# Patient Record
Sex: Male | Born: 1955
Health system: Southern US, Community
[De-identification: ages and names within clinical notes are randomized; demographics above are authoritative.]

## PROBLEM LIST (undated history)

## (undated) DIAGNOSIS — F32A Depression, unspecified: Secondary | ICD-10-CM

## (undated) DIAGNOSIS — I252 Old myocardial infarction: Secondary | ICD-10-CM

## (undated) DIAGNOSIS — I739 Peripheral vascular disease, unspecified: Secondary | ICD-10-CM

## (undated) DIAGNOSIS — E785 Hyperlipidemia, unspecified: Secondary | ICD-10-CM

## (undated) DIAGNOSIS — N529 Male erectile dysfunction, unspecified: Secondary | ICD-10-CM

## (undated) DIAGNOSIS — Z9289 Personal history of other medical treatment: Secondary | ICD-10-CM

## (undated) DIAGNOSIS — F419 Anxiety disorder, unspecified: Secondary | ICD-10-CM

## (undated) DIAGNOSIS — I2510 Atherosclerotic heart disease of native coronary artery without angina pectoris: Secondary | ICD-10-CM

## (undated) HISTORY — DX: Atherosclerotic heart disease of native coronary artery without angina pectoris: I25.10

## (undated) HISTORY — DX: Male erectile dysfunction, unspecified: N52.9

## (undated) HISTORY — DX: Anxiety disorder, unspecified: F41.9

## (undated) HISTORY — DX: Old myocardial infarction: I25.2

## (undated) HISTORY — DX: Depression, unspecified: F32.A

## (undated) HISTORY — PX: CARDIAC CATHETERIZATION: SHX172

## (undated) HISTORY — DX: Peripheral vascular disease, unspecified: I73.9

## (undated) HISTORY — DX: Personal history of other medical treatment: Z92.89

## (undated) HISTORY — DX: Hyperlipidemia, unspecified: E78.5

---

## 2003-02-27 ENCOUNTER — Inpatient Hospital Stay (HOSPITAL_COMMUNITY): Admission: EM | Admit: 2003-02-27 | Discharge: 2003-03-09 | Payer: Self-pay | Admitting: Emergency Medicine

## 2003-03-04 DIAGNOSIS — I2510 Atherosclerotic heart disease of native coronary artery without angina pectoris: Secondary | ICD-10-CM

## 2003-03-04 DIAGNOSIS — I252 Old myocardial infarction: Secondary | ICD-10-CM | POA: Insufficient documentation

## 2003-03-04 HISTORY — DX: Atherosclerotic heart disease of native coronary artery without angina pectoris: I25.10

## 2003-03-04 HISTORY — DX: Old myocardial infarction: I25.2

## 2003-03-05 HISTORY — PX: CORONARY ARTERY BYPASS GRAFT: SHX141

## 2003-03-28 ENCOUNTER — Encounter: Admission: RE | Admit: 2003-03-28 | Discharge: 2003-03-28 | Payer: Self-pay | Admitting: Cardiothoracic Surgery

## 2004-02-08 IMAGING — CR DG CHEST 1V PORT
1 series · 1 of 1 positions shown · non-contrast
Comparison: none

CLINICAL DATA: Myocardial infarction. 
 PORTABLE CHEST ([DATE])  
 Since the previous day?s portable exam, the patient has been extubated and a nasogastric tube removed.  Left chest tube is stable in place with no pneumothorax.  Swan-Ganz catheter and mediastinal drains stable.  There is some patchy perihilar and infrahilar subsegmental atelectasis or infiltrate which is slightly increased from previous exam.  Mild enlargement of the cardiac silhouette as before.
 IMPRESSION
 Extubation with some increase in perihilar and infrahilar atelectasis.

[view not recorded]
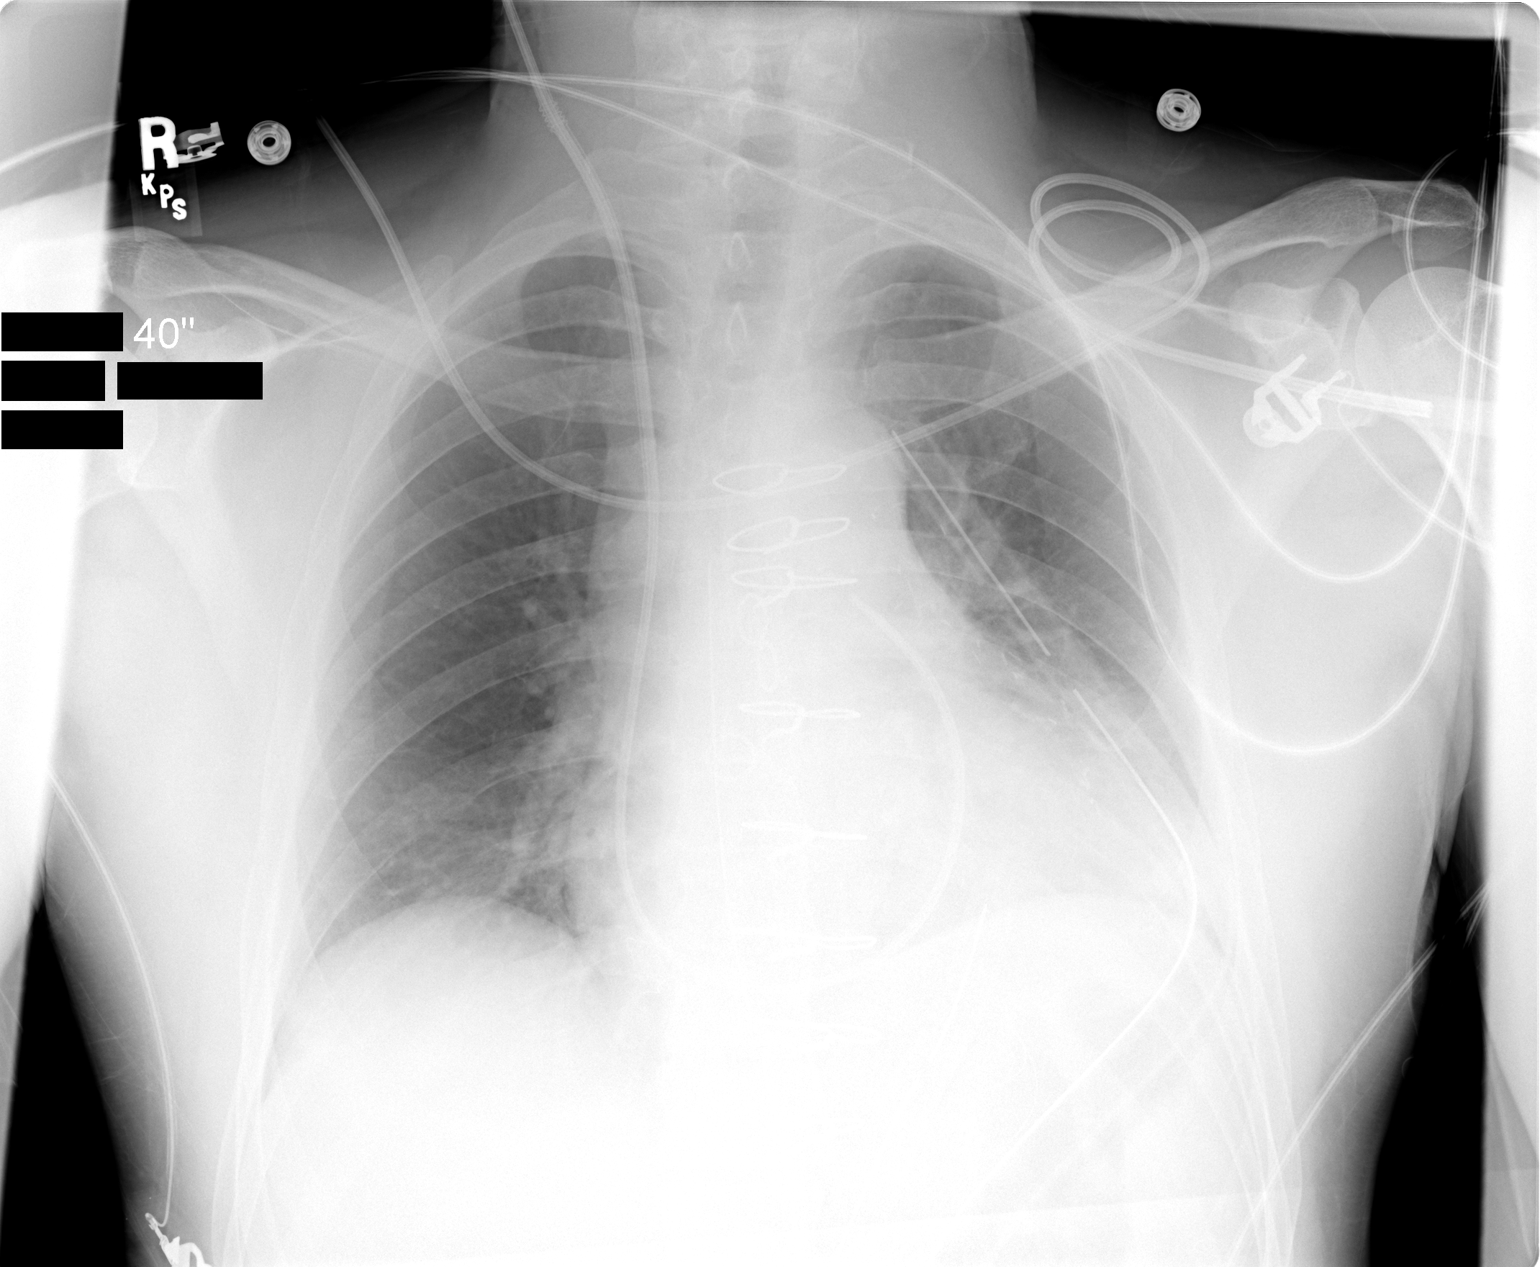

[1 of 1 positions shown; findings below may reference images not displayed]

## 2004-03-01 IMAGING — CR DG CHEST 2V
2 series · 2 of 2 positions shown · non-contrast
Comparison: none

CLINICAL DATA: Coronary artery disease, postop coronary artery bypass grafts. 
 TWO VIEW CHEST
 PA and lateral views of the chest are made and are compared to previous study of 03/07/03 from [HOSPITAL] and show slight improvement in right basilar aeration.  There remains some atelectasis and/or scarring and possibly a small effusion on the left side which has changed little. The heart is mildly enlarged and there are previous coronary artery bypass grafts noted.  
 IMPRESSION
 Interval improvement in right lower lung aeration.  There remains some atelectasis, infiltrate, and/or effusion on the left.

[view not recorded (1 of 2)]
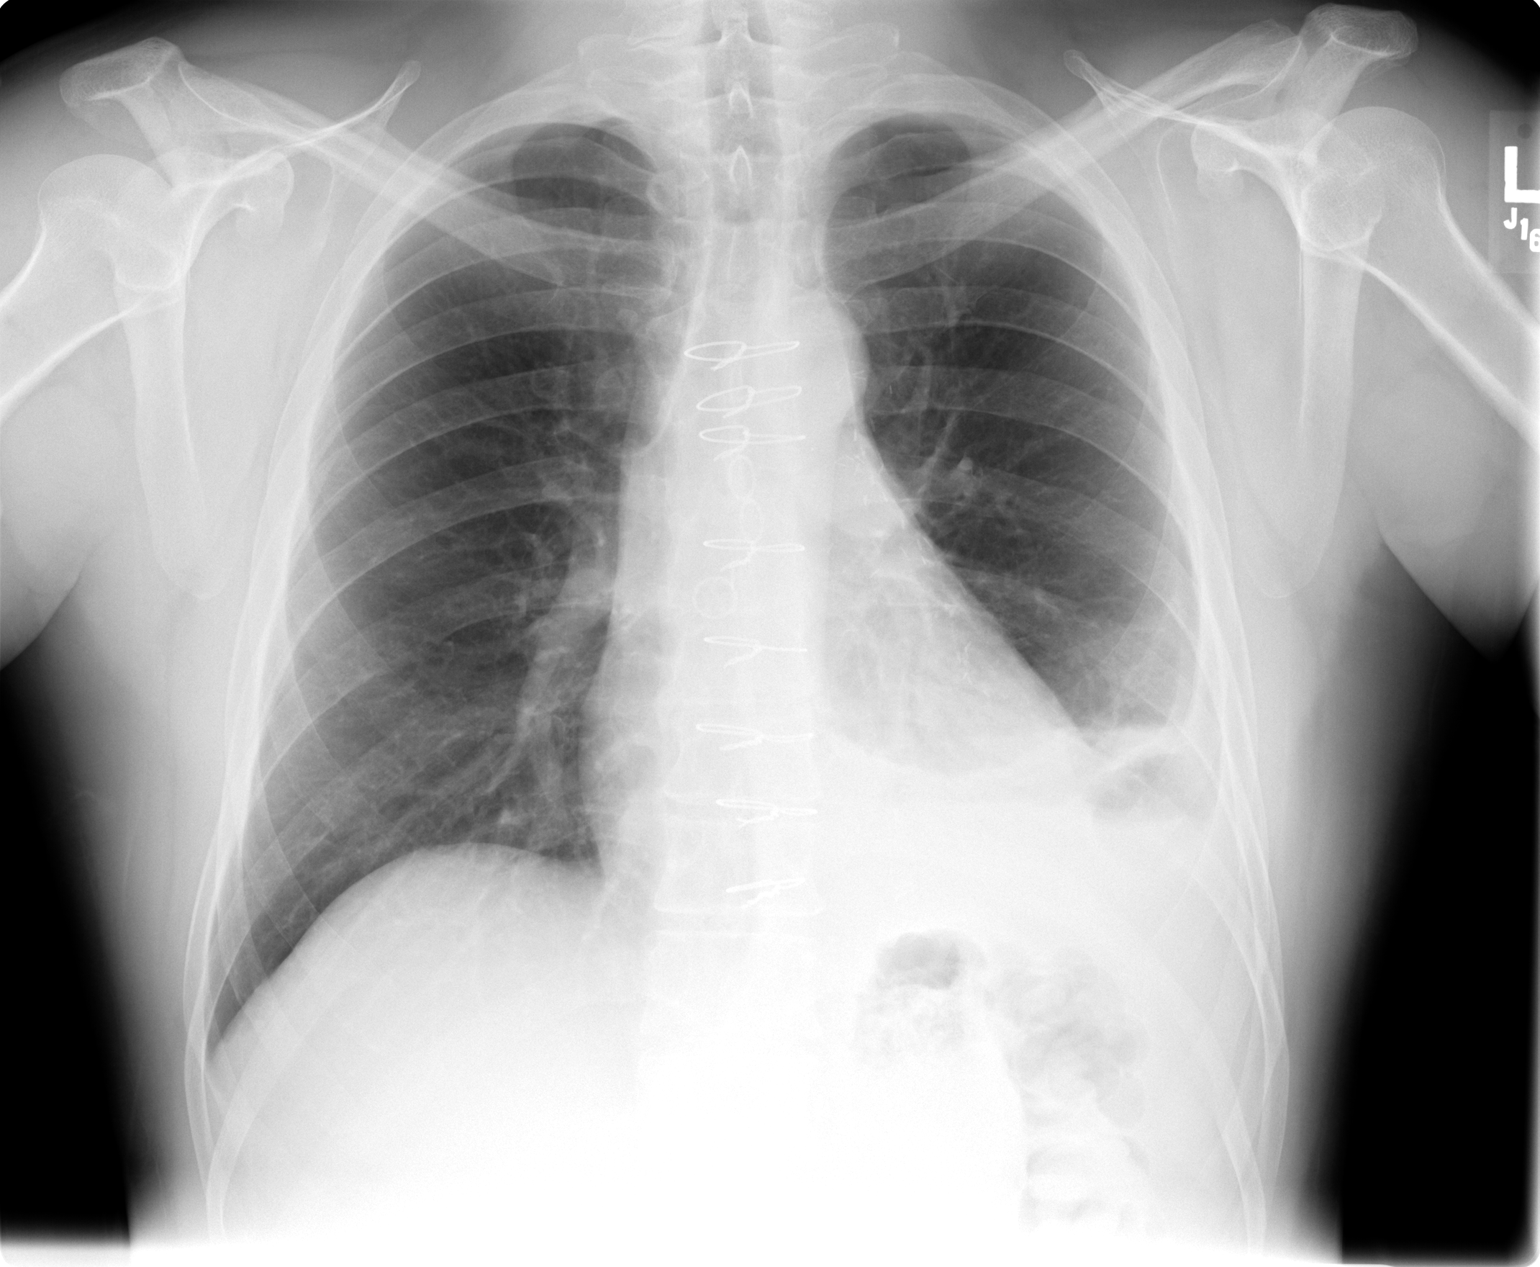

[view not recorded (2 of 2)]
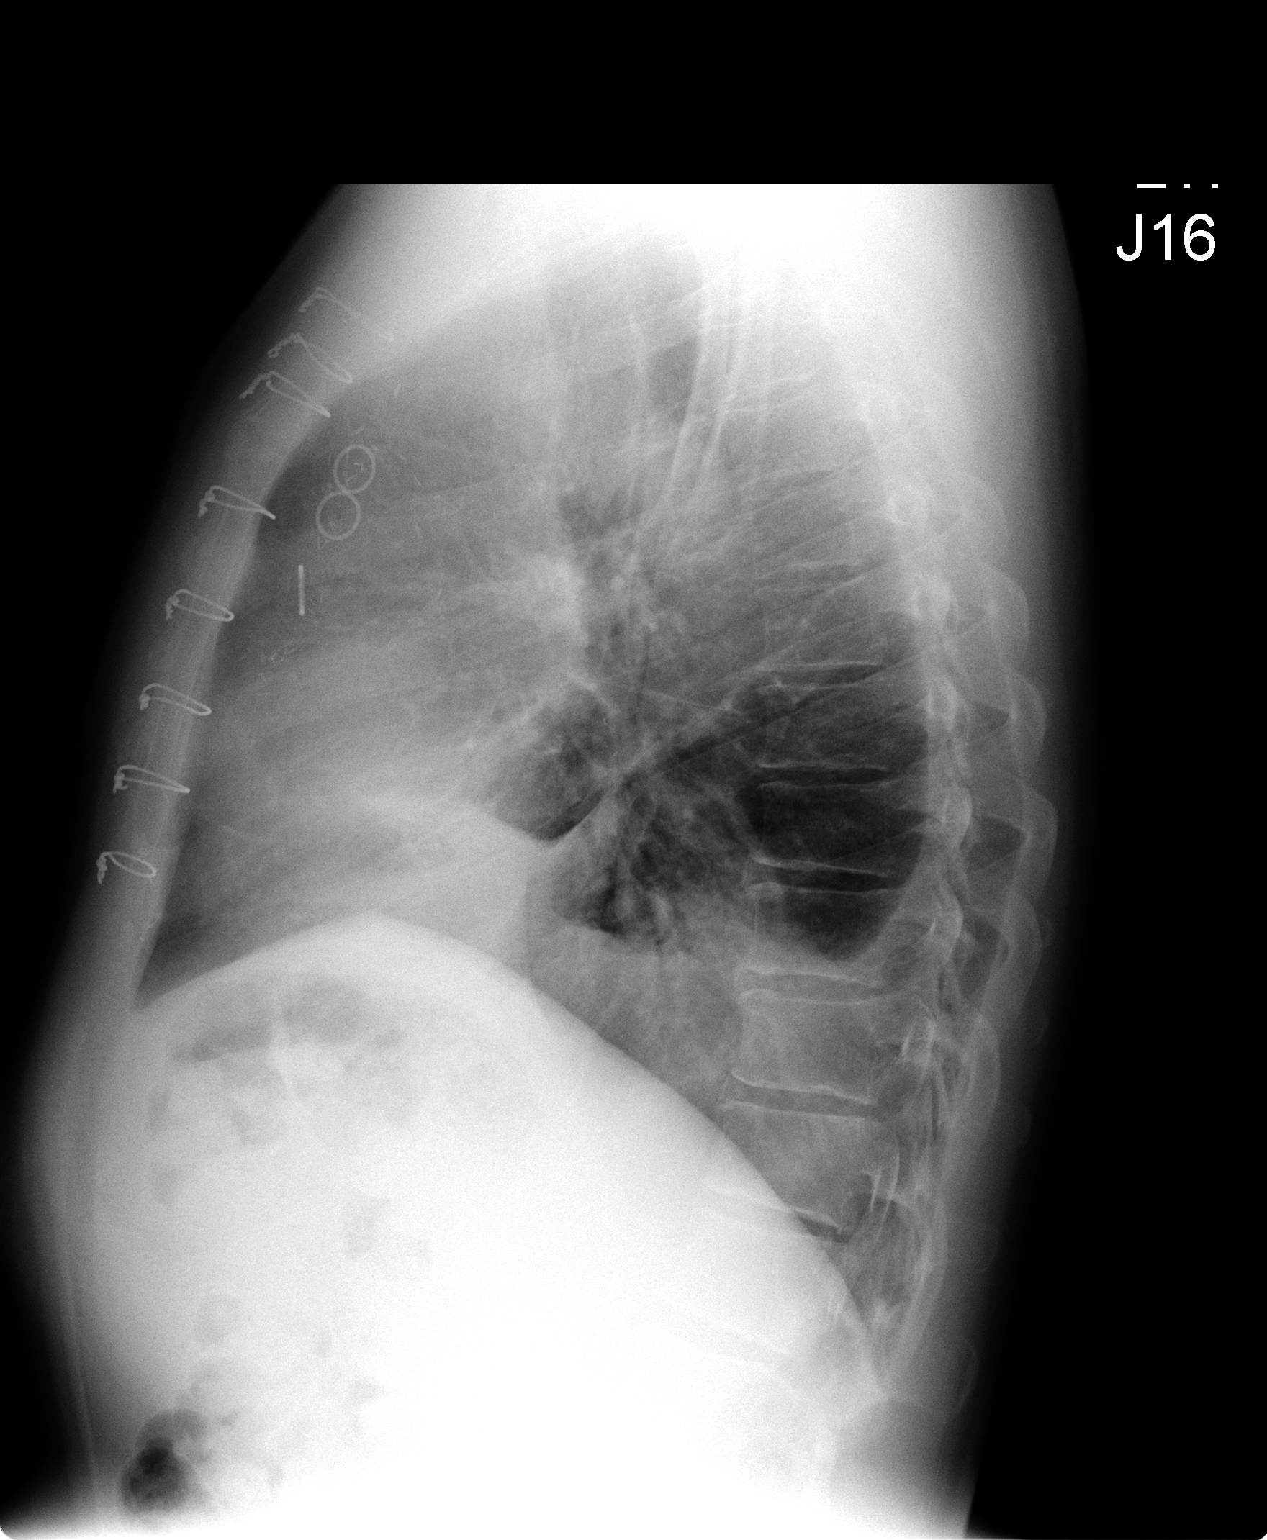

[2 of 2 positions shown; findings below may reference images not displayed]

## 2009-02-25 DIAGNOSIS — Z9289 Personal history of other medical treatment: Secondary | ICD-10-CM

## 2009-02-25 HISTORY — DX: Personal history of other medical treatment: Z92.89

## 2010-01-31 DIAGNOSIS — Z9289 Personal history of other medical treatment: Secondary | ICD-10-CM

## 2010-01-31 HISTORY — DX: Personal history of other medical treatment: Z92.89

## 2010-04-07 HISTORY — PX: COLONOSCOPY: SHX174

## 2011-08-12 DIAGNOSIS — I779 Disorder of arteries and arterioles, unspecified: Secondary | ICD-10-CM

## 2011-08-12 HISTORY — DX: Disorder of arteries and arterioles, unspecified: I77.9

## 2012-11-06 ENCOUNTER — Other Ambulatory Visit (HOSPITAL_COMMUNITY): Payer: Self-pay | Admitting: Cardiovascular Disease

## 2012-11-06 DIAGNOSIS — I6529 Occlusion and stenosis of unspecified carotid artery: Secondary | ICD-10-CM

## 2012-11-26 ENCOUNTER — Ambulatory Visit (HOSPITAL_COMMUNITY)
Admission: RE | Admit: 2012-11-26 | Discharge: 2012-11-26 | Disposition: A | Discharge status: Home / Self Care | Payer: Managed Care, Other (non HMO) | Source: Ambulatory Visit | Attending: Internal Medicine | Admitting: Internal Medicine

## 2012-11-26 DIAGNOSIS — I658 Occlusion and stenosis of other precerebral arteries: Secondary | ICD-10-CM | POA: Insufficient documentation

## 2012-11-26 DIAGNOSIS — I6529 Occlusion and stenosis of unspecified carotid artery: Secondary | ICD-10-CM

## 2012-11-26 NOTE — Progress Notes (Signed)
Carotid Duplex Completed. Laityn Bensen, BS, RDMS, RVT  

## 2012-12-02 ENCOUNTER — Encounter: Payer: Self-pay | Admitting: *Deleted

## 2012-12-04 ENCOUNTER — Encounter: Payer: Self-pay | Admitting: Cardiology

## 2012-12-04 ENCOUNTER — Encounter: Payer: Self-pay | Admitting: Cardiovascular Disease

## 2012-12-04 DIAGNOSIS — I2510 Atherosclerotic heart disease of native coronary artery without angina pectoris: Secondary | ICD-10-CM

## 2012-12-04 DIAGNOSIS — E785 Hyperlipidemia, unspecified: Secondary | ICD-10-CM | POA: Insufficient documentation

## 2012-12-04 DIAGNOSIS — I252 Old myocardial infarction: Secondary | ICD-10-CM

## 2012-12-05 ENCOUNTER — Encounter: Payer: Self-pay | Admitting: Cardiology

## 2012-12-05 ENCOUNTER — Ambulatory Visit (INDEPENDENT_AMBULATORY_CARE_PROVIDER_SITE_OTHER): Payer: Managed Care, Other (non HMO) | Admitting: Cardiology

## 2012-12-05 VITALS — BP 130/90 | HR 61 | Ht 67.0 in | Wt 180.0 lb

## 2012-12-05 DIAGNOSIS — E785 Hyperlipidemia, unspecified: Secondary | ICD-10-CM

## 2012-12-05 DIAGNOSIS — I2510 Atherosclerotic heart disease of native coronary artery without angina pectoris: Secondary | ICD-10-CM

## 2012-12-05 DIAGNOSIS — M129 Arthropathy, unspecified: Secondary | ICD-10-CM

## 2012-12-05 DIAGNOSIS — M1990 Unspecified osteoarthritis, unspecified site: Secondary | ICD-10-CM | POA: Insufficient documentation

## 2012-12-05 DIAGNOSIS — I779 Disorder of arteries and arterioles, unspecified: Secondary | ICD-10-CM

## 2012-12-05 MED ORDER — NABUMETONE 500 MG PO TABS: 500.0000 mg | ORAL_TABLET | Freq: Every day | ORAL | Status: DC

## 2012-12-05 MED ORDER — NABUMETONE 500 MG PO TABS: 500.0000 mg | ORAL_TABLET | Freq: Two times a day (BID) | ORAL | Status: DC | PRN

## 2012-12-05 NOTE — Assessment & Plan Note (Signed)
Followed by Dr Gates 

## 2012-12-05 NOTE — Assessment & Plan Note (Signed)
No angina 

## 2012-12-05 NOTE — Assessment & Plan Note (Signed)
He is complaining of pain in his hands

## 2012-12-05 NOTE — Assessment & Plan Note (Signed)
Dopplers 10/14 actually looked better, 0-49% bilat ICA stenosis

## 2012-12-05 NOTE — Progress Notes (Signed)
12/05/2012 Jorge Jackson   02/06/1955  [id number]  Primary Physicia Pcp Not In System Primary Cardiologist: Dr Allyson Sabal  HPI:  The patient is a very pleasant 57 year old married Caucasian male father of 1, grandfather to 1 grandson who I last saw Dr Allyson Sabal 6 months ago. He works at  [employer] in [place]. He had coronary artery bypass grafting in February of 2005 by Dr. Kathlee Nations Trigt with a LIMA to his LAD, a vein to a PDA, SVG-OM1 and SVG-OM2 and OM3 sequentially. He is totally asymptomatic. He has mild left internal carotid artery stenosis which we follow by duplex ultrasound and recent dopplers actually showed some improvement.. Dr. Samuel Germany follows his lipid profile. His last Myoview performed January 20, 2011 was nonischemic. He has problems with arthritis in his hands and says his sister and mother have Rheumatoid arthritis.     Current Outpatient Prescriptions  Medication Sig Dispense Refill  . ALPRAZolam (XANAX) 1 MG tablet Take 1 tablet by mouth at bedtime.      [name] aspirin 81 MG tablet Take 81 mg by mouth daily.      [name] atorvastatin (LIPITOR) 80 MG tablet Take 1 tablet by mouth at bedtime.      . clopidogrel (PLAVIX) 75 MG tablet Take 75 mg by mouth daily with breakfast.      . co-enzyme Q-10 30 MG capsule Take 30 mg by mouth 3 (three) times daily.      . fish oil-omega-3 fatty acids 1000 MG capsule Take 2 g by mouth daily.      [name] losartan (COZAAR) 50 MG tablet Take 50 mg by mouth daily.      . metoprolol succinate (TOPROL-XL) 50 MG 24 hr tablet Take 75 mg by mouth daily. Take with or immediately following a meal.      . nabumetone (RELAFEN) 500 MG tablet Take 1 tablet (500 mg total) by mouth 2 (two) times daily as needed for mild pain.  60 tablet  2  . tamsulosin (FLOMAX) 0.4 MG CAPS capsule Take 0.4 mg by mouth daily.      . traMADol (ULTRAM) 50 MG tablet Take 1 tablet by mouth as needed.       No current facility-administered medications for this visit.    Allergies  Allergen  Reactions  . Lipitor [Atorvastatin]   . Milk-Related Compounds     History   Social History  . Marital Status: Married    Spouse Name: N/A    Number of Children: N/A  . Years of Education: N/A   Occupational History  . Not on file.   Social History Main Topics  . Smoking status: Former Smoker    Types: Cigarettes    Quit date: 02/01/2003  . Smokeless tobacco: Not on file  . Alcohol Use: 0.6 oz/week    1 Cans of beer per week  . Drug Use: Not on file  . Sexual Activity: Not on file   Other Topics Concern  . Not on file   Social History Narrative  . No narrative on file     Review of Systems: General: negative for chills, fever, night sweats or weight changes.  Cardiovascular: negative for chest pain, dyspnea on exertion, edema, orthopnea, palpitations, paroxysmal nocturnal dyspnea or shortness of breath Dermatological: negative for rash Respiratory: negative for cough or wheezing Urologic: negative for hematuria Abdominal: negative for nausea, vomiting, diarrhea, bright red blood per rectum, melena, or hematemesis Neurologic: negative for visual changes, syncope, or dizziness All other systems  reviewed and are otherwise negative except as noted above.    Blood pressure 130/90, pulse 61, height 5\' 7"  (1.702 m), weight 180 lb (81.647 kg).  General appearance: alert, cooperative and no distress Neck: no carotid bruit and no JVD Lungs: clear to auscultation bilaterally Heart: regular rate and rhythm, S1, S2 normal, no murmur, click, rub or gallop Extremities: no edema  EKG NSR, incomplete RBBB  ASSESSMENT AND PLAN:   CAD CABG X 5 '05. Low risk Myoview 12/12 No angina  Mild carotid artery disease Dopplers 10/14 actually looked better, 0-49% bilat ICA stenosis  Hyperlipidemia LDL goal <70 Followed by Dr Kevan Ny  Arthritis He is complaining of pain in his hands    PLAN  I did give him an Rx for Relafen 500 mg BID which he says has helped in the past. He  will follow up with Dr Samuel Germany about this. We will get his recent lipid profile from Dr Martha Clan office. He can see Dr Allyson Sabal in 6 months.  Jermario Kalmar KPA-C 12/05/2012 1:20 PM

## 2012-12-05 NOTE — Patient Instructions (Signed)
Relafen 500 mg twice a day as needed for arthritis Your physician recommends that you schedule a follow-up appointment in: 6 months with Dr Allyson Sabal

## 2012-12-11 ENCOUNTER — Encounter: Payer: Self-pay | Admitting: Cardiology

## 2013-03-04 ENCOUNTER — Other Ambulatory Visit: Payer: Self-pay

## 2013-03-04 DIAGNOSIS — M1990 Unspecified osteoarthritis, unspecified site: Secondary | ICD-10-CM

## 2013-03-04 NOTE — Telephone Encounter (Signed)
Please advise for Nabumetone refills?

## 2013-03-05 NOTE — Telephone Encounter (Signed)
This should be handled by his primary care provider.  Kerin Ransom PA-C 03/05/2013 8:01 AM

## 2013-03-05 NOTE — Telephone Encounter (Signed)
Rx denied, per Kerin Ransom. Deferred to patient's PCP.

## 2013-03-08 ENCOUNTER — Telehealth: Payer: Self-pay | Admitting: Cardiology

## 2013-03-08 NOTE — Telephone Encounter (Signed)
Need to resend his Nabumetone prescription,it was not sent. Also he wants to know if it can be increased to 750 mg please.

## 2013-03-08 NOTE — Telephone Encounter (Signed)
Returned call and pt verified x 2.  Pt informed message received and refills will need to go to PCP.  Pt stated he still has refills, but wanted to get them through mail order.  Pt advised to use refills left and contact PCP about new Rx as we will not be able refill.  Pt verbalized understanding and agreed w/ plan.

## 2013-06-03 ENCOUNTER — Telehealth: Payer: Self-pay | Admitting: Cardiovascular Disease

## 2013-06-07 NOTE — Telephone Encounter (Signed)
Closed encounter °

## 2013-06-14 ENCOUNTER — Encounter: Payer: Self-pay | Admitting: Cardiovascular Disease

## 2013-06-14 ENCOUNTER — Ambulatory Visit (INDEPENDENT_AMBULATORY_CARE_PROVIDER_SITE_OTHER): Payer: Managed Care, Other (non HMO) | Admitting: Cardiovascular Disease

## 2013-06-14 VITALS — BP 120/68 | HR 73 | Ht 66.0 in | Wt 184.0 lb

## 2013-06-14 DIAGNOSIS — I2510 Atherosclerotic heart disease of native coronary artery without angina pectoris: Secondary | ICD-10-CM

## 2013-06-14 DIAGNOSIS — E785 Hyperlipidemia, unspecified: Secondary | ICD-10-CM

## 2013-06-14 NOTE — Assessment & Plan Note (Signed)
Coronary artery disease status post coronary artery bypass grafting February 2005 by Dr. Tharon Aquas Trigt with  With a  LIMA to his LAD, vein to the PDA, OM1 and 2 sequentially. He is totally asymptomatic. His last Myoview performed 01/20/11 was nonischemic.

## 2013-06-14 NOTE — Assessment & Plan Note (Signed)
On statin therapy followed by his PCP 

## 2013-06-14 NOTE — Patient Instructions (Signed)
Your physician wants you to follow-up in: 1 year with Dr Berry. You will receive a reminder letter in the mail two months in advance. If you don't receive a letter, please call our office to schedule the follow-up appointment.  

## 2013-06-14 NOTE — Progress Notes (Signed)
06/14/2013 Jorge Jackson   09-May-1955  [id number]  Primary Physician Pcp Not In System Primary Cardiologist: Lorretta Harp MD Renae Gloss   HPI:  The patient is a very pleasant 58 year old mildly overweight married Caucasian male father of 23, grandfather to 1 grandson who I last saw 6 months ago. He lost his job at the end of 2011. He worked at the [employer] in [place] which was outsourced to [place]. He was then hired by a [employer] in March of last year in [place] where he still works. He had coronary artery bypass grafting in February of 2005 by Dr. Tharon Aquas Trigt with a LIMA to his LAD, a vein to a PDA, OM1 and OM2 sequentially. He is totally asymptomatic. He has mild left internal carotid artery stenosis which we follow by duplex ultrasound. Dr. Micheal Likens follows his lipid profile. His last Myoview performed January 20, 2011 was nonischemic.he was seen by Kerin Ransom PAC 6 months ago. Since that time has remained clinically stable.     Current Outpatient Prescriptions  Medication Sig Dispense Refill  . ALPRAZolam (XANAX) 1 MG tablet Take 1 tablet by mouth at bedtime.      [name] aspirin 81 MG tablet Take 81 mg by mouth daily.      [name] atorvastatin (LIPITOR) 80 MG tablet Take 1 tablet by mouth at bedtime.      . clopidogrel (PLAVIX) 75 MG tablet Take 75 mg by mouth daily with breakfast.      . co-enzyme Q-10 30 MG capsule Take 30 mg by mouth 3 (three) times daily.      . fish oil-omega-3 fatty acids 1000 MG capsule Take 2 g by mouth daily.      [name] losartan (COZAAR) 50 MG tablet Take 50 mg by mouth daily.      . metoprolol succinate (TOPROL-XL) 50 MG 24 hr tablet Take 75 mg by mouth daily. Take with or immediately following a meal.      . nabumetone (RELAFEN) 500 MG tablet Take 1 tablet (500 mg total) by mouth 2 (two) times daily as needed for mild pain.  60 tablet  2  . tamsulosin (FLOMAX) 0.4 MG CAPS capsule Take 0.4 mg by mouth daily.      . traMADol (ULTRAM) 50 MG  tablet Take 1 tablet by mouth as needed.       No current facility-administered medications for this visit.    Allergies  Allergen Reactions  . Lipitor [Atorvastatin]   . Milk-Related Compounds     History   Social History  . Marital Status: Married    Spouse Name: N/A    Number of Children: N/A  . Years of Education: N/A   Occupational History  . Not on file.   Social History Main Topics  . Smoking status: Former Smoker    Types: Cigarettes    Quit date: 02/01/2003  . Smokeless tobacco: Not on file  . Alcohol Use: 0.6 oz/week    1 Cans of beer per week  . Drug Use: Not on file  . Sexual Activity: Not on file   Other Topics Concern  . Not on file   Social History Narrative  . No narrative on file     Review of Systems: General: negative for chills, fever, night sweats or weight changes.  Cardiovascular: negative for chest pain, dyspnea on exertion, edema, orthopnea, palpitations, paroxysmal nocturnal dyspnea or shortness of breath Dermatological: negative for rash Respiratory: negative for cough or  wheezing Urologic: negative for hematuria Abdominal: negative for nausea, vomiting, diarrhea, bright red blood per rectum, melena, or hematemesis Neurologic: negative for visual changes, syncope, or dizziness All other systems reviewed and are otherwise negative except as noted above.    Blood pressure 120/68, pulse 73, height 5\' 6"  (1.676 m), weight 184 lb (83.462 kg).  General appearance: alert and no distress Neck: no adenopathy, no carotid bruit, no JVD, supple, symmetrical, trachea midline and thyroid not enlarged, symmetric, no tenderness/mass/nodules Lungs: clear to auscultation bilaterally Heart: regular rate and rhythm, S1, S2 normal, no murmur, click, rub or gallop Extremities: extremities normal, atraumatic, no cyanosis or edema  EKG normal sinus rhythm at 73 without ST or T wave changes  ASSESSMENT AND PLAN:   CAD CABG X 5 '05. Low risk Myoview  12/12 Coronary artery disease status post coronary artery bypass grafting February 2005 by Dr. Tharon Aquas Trigt with  With a  LIMA to his LAD, vein to the PDA, OM1 and 2 sequentially. He is totally asymptomatic. His last Myoview performed 01/20/11 was nonischemic.  Hyperlipidemia LDL goal <70 On statin therapy followed by his PCP      Lorretta Harp MD [facility], [facility] 06/14/2013 12:08 PM

## 2014-08-19 ENCOUNTER — Ambulatory Visit (INDEPENDENT_AMBULATORY_CARE_PROVIDER_SITE_OTHER): Payer: BLUE CROSS/BLUE SHIELD | Admitting: Cardiovascular Disease

## 2014-08-19 ENCOUNTER — Encounter: Payer: Self-pay | Admitting: Cardiovascular Disease

## 2014-08-19 VITALS — BP 118/92 | HR 67 | Ht 66.0 in | Wt 177.0 lb

## 2014-08-19 DIAGNOSIS — E785 Hyperlipidemia, unspecified: Secondary | ICD-10-CM

## 2014-08-19 DIAGNOSIS — I2510 Atherosclerotic heart disease of native coronary artery without angina pectoris: Secondary | ICD-10-CM | POA: Diagnosis not present

## 2014-08-19 DIAGNOSIS — I779 Disorder of arteries and arterioles, unspecified: Secondary | ICD-10-CM

## 2014-08-19 DIAGNOSIS — I739 Peripheral vascular disease, unspecified: Secondary | ICD-10-CM

## 2014-08-19 NOTE — Patient Instructions (Signed)
Dr Berry recommends that you schedule a follow-up appointment in 1 year. You will receive a reminder letter in the mail two months in advance. If you don't receive a letter, please call our office to schedule the follow-up appointment. 

## 2014-08-19 NOTE — Assessment & Plan Note (Signed)
History of coronary artery disease status post  Coronary artery bypass grafting February 2005 by Dr. Prescott Gum with a LIMA to his LAD, vein graft to OM1 and onto sequentially onto the PDA. He is totally asymptomatic. His last Myoview performed 01/20/11 was nonischemic

## 2014-08-19 NOTE — Progress Notes (Signed)
08/19/2014 Jorge Jackson   Aug 21, 1955  [id number]  Primary Physician Wende Neighbors, MD Primary Cardiologist: Lorretta Harp MD Renae Gloss   HPI:  The patient is a very pleasant 59 year old mildly overweight married Caucasian male father of 54, grandfather to 1 grandson who I last saw 06/14/13.[name] He lost his job at the end of 2011. He worked at the [employer] in [place] which was outsourced to [place]. He was then hired by a [employer] in March of last year in [place] where he still works. He had coronary artery bypass grafting in February of 2005 by Dr. Tharon Aquas Trigt with a LIMA to his LAD, a vein to a PDA, OM1 and OM2 sequentially. He is totally asymptomatic. He has mild left internal carotid artery stenosis which we follow by duplex ultrasound. Dr. Micheal Likens follows his lipid profile. His last Myoview performed January 20, 2011 was nonischemic. Since I saw him a year ago he's remained clinically stable denying chest pain or shortness of breath.   Current Outpatient Prescriptions  Medication Sig Dispense Refill  . ALPRAZolam (XANAX) 1 MG tablet Take 1 tablet by mouth at bedtime.    [name] aspirin 81 MG tablet Take 81 mg by mouth daily.    [name] atorvastatin (LIPITOR) 80 MG tablet Take 1 tablet by mouth at bedtime.    [name] CIALIS 20 MG tablet Take 20 mg by mouth as needed.     . clopidogrel (PLAVIX) 75 MG tablet Take 75 mg by mouth daily with breakfast.    . co-enzyme Q-10 30 MG capsule Take 30 mg by mouth 3 (three) times daily.    . fish oil-omega-3 fatty acids 1000 MG capsule Take 2 g by mouth daily.    [name] losartan (COZAAR) 50 MG tablet Take 50 mg by mouth daily.    . metoprolol succinate (TOPROL-XL) 50 MG 24 hr tablet Take 75 mg by mouth daily. Take with or immediately following a meal.    . tamsulosin (FLOMAX) 0.4 MG CAPS capsule Take 0.4 mg by mouth daily.    . traMADol (ULTRAM) 50 MG tablet Take 1 tablet by mouth as needed.    . Vitamin D, Ergocalciferol, (DRISDOL) 50000 UNITS CAPS  capsule      No current facility-administered medications for this visit.    Allergies  Allergen Reactions  . Lipitor [Atorvastatin]   . Milk-Related Compounds     History   Social History  . Marital Status: Married    Spouse Name: N/A  . Number of Children: N/A  . Years of Education: N/A   Occupational History  . Not on file.   Social History Main Topics  . Smoking status: Former Smoker    Types: Cigarettes    Quit date: 02/01/2003  . Smokeless tobacco: Not on file  . Alcohol Use: 0.6 oz/week    1 Cans of beer per week  . Drug Use: Not on file  . Sexual Activity: Not on file   Other Topics Concern  . Not on file   Social History Narrative     Review of Systems: General: negative for chills, fever, night sweats or weight changes.  Cardiovascular: negative for chest pain, dyspnea on exertion, edema, orthopnea, palpitations, paroxysmal nocturnal dyspnea or shortness of breath Dermatological: negative for rash Respiratory: negative for cough or wheezing Urologic: negative for hematuria Abdominal: negative for nausea, vomiting, diarrhea, bright red blood per rectum, melena, or hematemesis Neurologic: negative for visual changes, syncope, or dizziness All other systems  reviewed and are otherwise negative except as noted above.    Blood pressure 118/92, pulse 67, height 5\' 6"  (1.676 m), weight 177 lb (80.287 kg).  General appearance: alert and no distress Neck: no adenopathy, no carotid bruit, no JVD, supple, symmetrical, trachea midline and thyroid not enlarged, symmetric, no tenderness/mass/nodules Lungs: clear to auscultation bilaterally Heart: regular rate and rhythm, S1, S2 normal, no murmur, click, rub or gallop Extremities: extremities normal, atraumatic, no cyanosis or edema  EKG normal sinus rhythm with 67 without ST or T-wave changes. I personally reviewed this EKG  ASSESSMENT AND PLAN:   Mild carotid artery disease Mild left ICA stenosis by duplex  ultrasound last checked 11/26/12  Hyperlipidemia LDL goal <70 History of hyperlipidemia on atorvastatin 80 mg a day followed by his PCP  CAD CABG X 5 '05. Low risk Myoview 12/12 History of coronary artery disease status post  Coronary artery bypass grafting February 2005 by Dr. Prescott Gum with a LIMA to his LAD, vein graft to OM1 and onto sequentially onto the PDA. He is totally asymptomatic. His last Myoview performed 01/20/11 was nonischemic      Lorretta Harp MD [place], [facility] 08/19/2014 4:36 PM

## 2014-08-19 NOTE — Assessment & Plan Note (Signed)
Mild left ICA stenosis by duplex ultrasound last checked 11/26/12

## 2014-08-19 NOTE — Assessment & Plan Note (Signed)
History of hyperlipidemia on atorvastatin 80 mg a day followed by his PCP 

## 2015-08-17 DIAGNOSIS — Z0000 Encounter for general adult medical examination without abnormal findings: Secondary | ICD-10-CM | POA: Diagnosis not present

## 2015-08-17 DIAGNOSIS — E785 Hyperlipidemia, unspecified: Secondary | ICD-10-CM | POA: Diagnosis not present

## 2015-08-17 DIAGNOSIS — I2510 Atherosclerotic heart disease of native coronary artery without angina pectoris: Secondary | ICD-10-CM | POA: Diagnosis not present

## 2015-08-17 DIAGNOSIS — E559 Vitamin D deficiency, unspecified: Secondary | ICD-10-CM | POA: Diagnosis not present

## 2015-08-17 DIAGNOSIS — I10 Essential (primary) hypertension: Secondary | ICD-10-CM | POA: Diagnosis not present

## 2015-08-21 ENCOUNTER — Encounter: Payer: Self-pay | Admitting: Cardiovascular Disease

## 2015-09-30 ENCOUNTER — Encounter: Payer: Self-pay | Admitting: Cardiovascular Disease

## 2015-09-30 ENCOUNTER — Telehealth: Payer: Self-pay | Admitting: Cardiovascular Disease

## 2015-09-30 NOTE — Telephone Encounter (Signed)
Closed encounter °

## 2015-10-16 ENCOUNTER — Ambulatory Visit: Payer: BLUE CROSS/BLUE SHIELD | Admitting: Cardiovascular Disease

## 2015-11-04 ENCOUNTER — Ambulatory Visit: Payer: BLUE CROSS/BLUE SHIELD | Admitting: Cardiovascular Disease

## 2015-11-06 ENCOUNTER — Ambulatory Visit (INDEPENDENT_AMBULATORY_CARE_PROVIDER_SITE_OTHER): Payer: BLUE CROSS/BLUE SHIELD | Admitting: Cardiovascular Disease

## 2015-11-06 ENCOUNTER — Encounter: Payer: Self-pay | Admitting: Cardiovascular Disease

## 2015-11-06 VITALS — BP 134/90 | HR 67 | Ht 67.0 in | Wt 184.4 lb

## 2015-11-06 DIAGNOSIS — R0683 Snoring: Secondary | ICD-10-CM

## 2015-11-06 DIAGNOSIS — Z0000 Encounter for general adult medical examination without abnormal findings: Secondary | ICD-10-CM

## 2015-11-06 DIAGNOSIS — G4730 Sleep apnea, unspecified: Secondary | ICD-10-CM

## 2015-11-06 NOTE — Patient Instructions (Signed)
Medication Instructions:  NO CHANGES.   Testing/Procedures: Your physician has recommended that you have a sleep study. This test records several body functions during sleep, including: brain activity, eye movement, oxygen and carbon dioxide blood levels, heart rate and rhythm, breathing rate and rhythm, the flow of air through your mouth and nose, snoring, body muscle movements, and chest and belly movement.    Follow-Up: Your physician wants you to follow-up in: [place].   You will receive a reminder letter in the mail two months in advance. If you don't receive a letter, please call our office to schedule the follow-up appointment.   If you need a refill on your cardiac medications before your next appointment, please call your pharmacy.

## 2015-11-06 NOTE — Assessment & Plan Note (Signed)
History of hyperlipidemia on statin therapy with recent lipid profile performed by his PCP 08/17/15 revealed a total cholesterol 141, LDL 69 and HDL of 56.

## 2015-11-06 NOTE — Assessment & Plan Note (Signed)
History of CAD status post coronary artery bypass grafting X 08 March 2003 by Dr. Tharon Aquas Trigt with a LIMA to his LAD, vein to the PDA, OM 1 and 2 sequentially. He is asymptomatic. His last Myoview performed 01/20/11 was nonischemic.

## 2015-11-06 NOTE — Progress Notes (Addendum)
11/06/2015 Jorge Jackson   [birth year]  [id number]  Primary Physician [name], MD Primary Cardiologist: Lorretta Harp MD [name], [place]  HPI:  The patient is a very pleasant 60 year old mildly overweight married Caucasian male father of 72, grandfather to 1 grandson who I last saw 08/19/14.[name] He lost his job at the end of 2011. He worked at the [employer] in [place] which was outsourced to [place]. He was then hired by a [employer] in March of last year in [place] where he still works. He had coronary artery bypass grafting in February of 2005 by Dr. Tharon Aquas Trigt with a LIMA to his LAD, a vein to a PDA, OM1 and OM2 sequentially. He is totally asymptomatic. He has mild left internal carotid artery stenosis which we follow by duplex ultrasound. Dr. Micheal Likens follows his lipid profile. His last Myoview performed January 20, 2011 was nonischemic. Since I saw him a year ago he's remained clinically stable denying chest pain or shortness of breath. His most recent lipid profile performed by his PCP 08/17/15 revealed total cholesterol 141, LDL 69 and HDL of 56. He does complain of lethargy and fatigue and relates symptoms compatible with obstructive sleep apnea. I am sending him for a sleep study and CPAP if applicable.    Current Outpatient Prescriptions  Medication Sig Dispense Refill  . ALPRAZolam (XANAX) 1 MG tablet Take 1 tablet by mouth at bedtime.    [name] aspirin 81 MG tablet Take 81 mg by mouth daily.    [name] atorvastatin (LIPITOR) 80 MG tablet Take 1 tablet by mouth at bedtime.    [name] CIALIS 20 MG tablet Take 20 mg by mouth as needed.     . clopidogrel (PLAVIX) 75 MG tablet Take 75 mg by mouth daily with breakfast.    . co-enzyme Q-10 30 MG capsule Take 30 mg by mouth 3 (three) times daily.    . fish oil-omega-3 fatty acids 1000 MG capsule Take 2 g by mouth daily.    [name] losartan (COZAAR) 50 MG tablet Take 50 mg by mouth daily.    . metoprolol succinate (TOPROL-XL) 50 MG 24 hr  tablet Take 75 mg by mouth daily. Take with or immediately following a meal.    . tamsulosin (FLOMAX) 0.4 MG CAPS capsule Take 0.4 mg by mouth daily.    . traMADol (ULTRAM) 50 MG tablet Take 1 tablet by mouth as needed.     No current facility-administered medications for this visit.     Allergies  Allergen Reactions  . Lipitor [Atorvastatin]   . Milk-Related Compounds     Social History   Social History  . Marital status: Married    Spouse name: N/A  . Number of children: N/A  . Years of education: N/A   Occupational History  . Not on file.   Social History Main Topics  . Smoking status: Former Smoker    Types: Cigarettes    Quit date: 02/01/2003  . Smokeless tobacco: Not on file  . Alcohol use 0.6 oz/week    1 Cans of beer per week  . Drug use: Unknown  . Sexual activity: Not on file   Other Topics Concern  . Not on file   Social History Narrative  . No narrative on file     Review of Systems: General: negative for chills, fever, night sweats or weight changes.  Cardiovascular: negative for chest pain, dyspnea on exertion, edema, orthopnea, palpitations, paroxysmal nocturnal dyspnea  or shortness of breath Dermatological: negative for rash Respiratory: negative for cough or wheezing Urologic: negative for hematuria Abdominal: negative for nausea, vomiting, diarrhea, bright red blood per rectum, melena, or hematemesis Neurologic: negative for visual changes, syncope, or dizziness All other systems reviewed and are otherwise negative except as noted above.    Blood pressure 134/90, pulse 67, height 5\' 7"  (1.702 m), weight 184 lb 6.4 oz (83.6 kg).  General appearance: alert and no distress Neck: no adenopathy, no carotid bruit, no JVD, supple, symmetrical, trachea midline and thyroid not enlarged, symmetric, no tenderness/mass/nodules Lungs: clear to auscultation bilaterally Heart: regular rate and rhythm, S1, S2 normal, no murmur, click, rub or  gallop Extremities: extremities normal, atraumatic, no cyanosis or edema  EKG sinus rhythm at 67 without ST or T-wave changes. I personally reviewed this EKG  ASSESSMENT AND PLAN:   CAD CABG X 5 '05. Low risk Myoview 12/12 History of CAD status post coronary artery bypass grafting X 08 March 2003 by Dr. Tharon Aquas Trigt with a LIMA to his LAD, vein to the PDA, OM 1 and 2 sequentially. He is asymptomatic. His last Myoview performed 01/20/11 was nonischemic.  Hyperlipidemia LDL goal <70 History of hyperlipidemia on statin therapy with recent lipid profile performed by his PCP 08/17/15 revealed a total cholesterol 141, LDL 69 and HDL of 56.      Lorretta Harp MD FACP,FACC,FAHA, [facility] 11/06/2015 4:57 PM

## 2015-12-11 ENCOUNTER — Ambulatory Visit (HOSPITAL_BASED_OUTPATIENT_CLINIC_OR_DEPARTMENT_OTHER): Payer: BLUE CROSS/BLUE SHIELD | Attending: Cardiovascular Disease | Admitting: Cardiovascular Disease

## 2015-12-11 VITALS — Ht 67.0 in | Wt 184.0 lb

## 2015-12-11 DIAGNOSIS — R0683 Snoring: Secondary | ICD-10-CM | POA: Diagnosis not present

## 2015-12-11 DIAGNOSIS — G4733 Obstructive sleep apnea (adult) (pediatric): Secondary | ICD-10-CM | POA: Diagnosis not present

## 2015-12-11 DIAGNOSIS — G4730 Sleep apnea, unspecified: Secondary | ICD-10-CM

## 2015-12-28 ENCOUNTER — Telehealth: Payer: Self-pay | Admitting: Cardiovascular Disease

## 2015-12-28 NOTE — Telephone Encounter (Signed)
Jorge Jackson is calling to get his results of his sleep study . Please call

## 2015-12-29 NOTE — Telephone Encounter (Signed)
Pt calling again,he would like his sleep study results please.

## 2015-12-29 NOTE — Telephone Encounter (Signed)
Split night study on 11/10 - pending Dr. Evette Georges interpretation of results. Pt aware this is pending physician review and we will call when ready. Pt voiced appreciation and understanding.

## 2016-01-06 ENCOUNTER — Encounter: Payer: Self-pay | Admitting: Cardiovascular Disease

## 2016-01-06 NOTE — Procedures (Signed)
Patient Name: Jorge Jackson, Jorge Jackson Date: 12/11/2015 Gender: Male D.O.B: [birth year] Age (years): 19 Referring Provider: Lorretta Harp Height (inches): 67 Interpreting Physician: [name] MD, ABSM Weight (lbs): 189 RPSGT: Gerhard Perches BMI: 30 MRN: [id number] Neck Size: 17.00  CLINICAL INFORMATION Sleep Study Type: Split Night CPAP Indication for sleep study: Snoring Epworth Sleepiness Score: 15  SLEEP STUDY TECHNIQUE As per the AASM Manual for the Scoring of Sleep and Associated Events v2.3 (April 2016) with a hypopnea requiring 4% desaturations. The channels recorded and monitored were frontal, central and occipital EEG, electrooculogram (EOG), submentalis EMG (chin), nasal and oral airflow, thoracic and abdominal wall motion, anterior tibialis EMG, snore microphone, electrocardiogram, and pulse oximetry. Continuous positive airway pressure (CPAP) was initiated when the patient met split night criteria and was titrated according to treat sleep-disordered breathing.  MEDICATIONS ALPRAZolam (XANAX) 1 MG tablet aspirin 81 MG tablet atorvastatin (LIPITOR) 80 MG tablet CIALIS 20 MG tablet clopidogrel (PLAVIX) 75 MG tablet co-enzyme Q-10 30 MG capsule fish oil-omega-3 fatty acids 1000 MG capsule losartan (COZAAR) 50 MG tablet metoprolol succinate (TOPROL-XL) 50 MG 24 hr tablet tamsulosin (FLOMAX) 0.4 MG CAPS capsule traMADol (ULTRAM) 50 MG tablet  Medications self-administered by patient taken the night of the study : TIZANIDINE, XANAX  RESPIRATORY PARAMETERS Diagnostic Total AHI (/hr): 19.1 RDI (/hr): 21.4 OA Index (/hr): 4.5 CA Index (/hr): 0.3 REM AHI (/hr): 43.0 NREM AHI (/hr): 15.1 Supine AHI (/hr): 19.1 Non-supine AHI (/hr): N/A Min O2 Sat (%): 76.00 Mean O2 (%): 94.41 Time below 88% (min): 6.0   Titration Optimal Pressure (cm): 16 AHI at Optimal Pressure (/hr): 0.0 Min O2 at Optimal Pressure (%): 94.0 Supine % at Optimal (%): 100 Sleep % at Optimal  (%): 18    SLEEP ARCHITECTURE The recording time for the entire night was 357.9 minutes. During a baseline period of 191.5 minutes, the patient slept for 185.3 minutes in REM and nonREM, yielding a sleep efficiency of 96.8%. Sleep onset after lights out was 5.2 minutes with a REM latency of 151.5 minutes. The patient spent 4.05% of the night in stage N1 sleep, 81.65% in stage N2 sleep, 0.00% in stage N3 and 14.30% in REM. During the titration period of 164.1 minutes, the patient slept for 119.0 minutes in REM and nonREM, yielding a sleep efficiency of 72.5%. Sleep onset after CPAP initiation was 13.9 minutes with a REM latency of 63.5 minutes. The patient spent 28.57% of the night in stage N1 sleep, 59.66% in stage N2 sleep, 0.00% in stage N3 and 11.76% in REM.  CARDIAC DATA The 2 lead EKG demonstrated sinus rhythm. The mean heart rate was 61.26 beats per minute. Other EKG findings include: None.  LEG MOVEMENT DATA The total Periodic Limb Movements of Sleep (PLMS) were 0. The PLMS index was 0.00.  IMPRESSIONS - Moderate obstructive sleep apnea overall (AHI = 19.1/hour); however, severe sleep apnea during REM sleep (AHI 43.0/h).   The patient was titrated to an optimal PAP pressure at 16 cm of water.  AHI at 16 cm was 0 with a minimum oxygen saturation at 94%. - No significant central sleep apnea occurred during the diagnostic portion of the study (CAI = 0.3/hour). - Significant oxygen desaturation to a nadir of 76.00% during the diagnostic study. - The patient snored with Moderate snoring volume during the diagnostic portion of the study. - No cardiac abnormalities were noted during this study. - Clinically significant periodic limb movements did not occur during sleep.  DIAGNOSIS -  Obstructive Sleep Apnea (327.23 [G47.33 ICD-10])  RECOMMENDATIONS - Recommend an initial trial of CPAP therapy at 16 cm H2O with  heated humidification. A Medium size Resmed Full Face Mask AirFit F20 mask was  used for the titration. - Efforts should be made to optimize nasal and oropharyngeal patency. - Avoid alcohol, sedatives and other CNS depressants that may worsen sleep apnea and disrupt normal sleep architecture. - Sleep hygiene should be reviewed to assess factors that may improve sleep quality. - Weight management and regular exercise should be initiated or continued. - Recommend a download be obtained in 30 days and sleep clinic evaluation.  [Electronically signed] 01/06/2016 10:49 PM  [name] MD, [facility], [place], American Board of Sleep Medicine   NPI: [id number] [place] PH: [phone]   FX: [phone] [place]

## 2016-01-14 NOTE — Telephone Encounter (Signed)
New message  Pt is calling for the second time for results of the sleep study done on 11/10  Please call back and advise

## 2016-01-21 ENCOUNTER — Telehealth: Payer: Self-pay | Admitting: *Deleted

## 2016-01-21 ENCOUNTER — Telehealth: Payer: Self-pay | Admitting: Cardiovascular Disease

## 2016-01-21 NOTE — Telephone Encounter (Signed)
Left message for patient to return a call to discuss results of sleep study.

## 2016-01-21 NOTE — Telephone Encounter (Signed)
Left message to call to discuss sleep study results.

## 2016-01-21 NOTE — Telephone Encounter (Signed)
Faxed CPAP order to [facility] for set up.

## 2016-01-21 NOTE — Telephone Encounter (Signed)
Left message to return a call to discuss sleep study results. 

## 2016-01-21 NOTE — Telephone Encounter (Signed)
°  Follow Up   Pt is returning call from earlier. Pt is requesting a phone call at 4:00 PM as he is at work currently.

## 2016-01-22 ENCOUNTER — Telehealth: Payer: Self-pay | Admitting: Cardiovascular Disease

## 2016-01-22 NOTE — Telephone Encounter (Signed)
New message  Pt is calling again to get results of sleep study  Please call back

## 2016-01-22 NOTE — Telephone Encounter (Signed)
Spoke with patient giving him sleep study results and recommendations. Patient voiced understanding.

## 2016-01-22 NOTE — Telephone Encounter (Signed)
Returned a call to patient informing him of sleep study results and recommendations. Patient voiced understanding.

## 2016-02-04 NOTE — Telephone Encounter (Signed)
Patient should have receive results and have been set up by DME company for CPAP initiation.

## 2016-02-12 DIAGNOSIS — G4733 Obstructive sleep apnea (adult) (pediatric): Secondary | ICD-10-CM | POA: Diagnosis not present

## 2016-02-19 DIAGNOSIS — Z6829 Body mass index (BMI) 29.0-29.9, adult: Secondary | ICD-10-CM | POA: Diagnosis not present

## 2016-02-19 DIAGNOSIS — E663 Overweight: Secondary | ICD-10-CM | POA: Diagnosis not present

## 2016-02-19 DIAGNOSIS — E785 Hyperlipidemia, unspecified: Secondary | ICD-10-CM | POA: Diagnosis not present

## 2016-02-19 DIAGNOSIS — M545 Low back pain: Secondary | ICD-10-CM | POA: Diagnosis not present

## 2016-02-19 DIAGNOSIS — E559 Vitamin D deficiency, unspecified: Secondary | ICD-10-CM | POA: Diagnosis not present

## 2016-02-19 DIAGNOSIS — I10 Essential (primary) hypertension: Secondary | ICD-10-CM | POA: Diagnosis not present

## 2016-03-14 DIAGNOSIS — G4733 Obstructive sleep apnea (adult) (pediatric): Secondary | ICD-10-CM | POA: Diagnosis not present

## 2016-03-23 ENCOUNTER — Encounter: Payer: Self-pay | Admitting: Cardiovascular Disease

## 2016-03-23 ENCOUNTER — Ambulatory Visit (INDEPENDENT_AMBULATORY_CARE_PROVIDER_SITE_OTHER): Payer: BLUE CROSS/BLUE SHIELD | Admitting: Cardiovascular Disease

## 2016-03-23 VITALS — BP 120/83 | Ht 67.0 in | Wt 187.0 lb

## 2016-03-23 DIAGNOSIS — R0683 Snoring: Secondary | ICD-10-CM

## 2016-03-23 DIAGNOSIS — E785 Hyperlipidemia, unspecified: Secondary | ICD-10-CM

## 2016-03-23 DIAGNOSIS — I779 Disorder of arteries and arterioles, unspecified: Secondary | ICD-10-CM | POA: Diagnosis not present

## 2016-03-23 DIAGNOSIS — I2510 Atherosclerotic heart disease of native coronary artery without angina pectoris: Secondary | ICD-10-CM | POA: Diagnosis not present

## 2016-03-23 DIAGNOSIS — I739 Peripheral vascular disease, unspecified: Secondary | ICD-10-CM

## 2016-03-23 DIAGNOSIS — G4733 Obstructive sleep apnea (adult) (pediatric): Secondary | ICD-10-CM

## 2016-03-23 NOTE — Patient Instructions (Signed)
Your physician recommends that you schedule a follow-up appointment if needed for sleep with Dr Claiborne Billings.

## 2016-03-30 NOTE — Progress Notes (Signed)
Cardiology Office Note    Date:  03/30/2016   ID:  Jorge Jackson, DOB May 04, 1955, MRN [id number]  PCP:  Jorge Doyne, MD  Cardiologist:  Jorge Majestic, MD ( sleep); Dr. Gwenlyn Jackson  Chief Complaint  Patient presents with  . New Evaluation  . Sleep Apnea    History of Present Illness:  Jorge Jackson is a 61 y.o. male who presents for his initial sleep clinic evaluation following initiation of CPAP therapy.  Mr. Jorge Jackson is followed by Dr. Gwenlyn Jackson for his cardiology care.  He underwent CABG revascularization surgery in February 2005 by Dr. [name] trite and had a LIMA placed to his LAD, veins to the PDA, OM1, and OM2 vessels.  When he had recently seen Dr. Gwenlyn Jackson.  He was complaining of lethargy and fatigue, and there was concern of symptoms associated with sleep apnea.  He was referred for a sleep study which was done on 12/11/2015.  This revealed moderate sleep apnea with an HI of 19.1 per hour; however sleep apnea was severe during grams sleep with an AHI of 43.0.  There was significant oxygen desaturation to a nadir of 76% and there was evidence for moderate snoring.  CPAP was implemented and he was titrated up to 16 cm water pressure.  At that pressure.  AHI was 0 and minimum oxygen saturation was 94%.  His DME company is [facility] and is set up date was 02/12/2016.  He has a ResMed AirSense 10 AutoSet unit which has been at a set pressure of 16 cm.  He has a [employer] F 20 medium mask.  A download was obtained from 02/20/2016 through 03/20/2016.  He is meeting compliance standards with 90% of usage stays and 83% of days with usage greater than 4 hours.  However, his average use is only 6 hours and 5 minutes.  AHI is 8.8, with an apnea index of 7.0, and a hypopnea index of 1.8.  Central index was 0.7.  Except for 2 days, there was no significant leak.  Typically he goes to bed at 9 PM but wakes up at 3:50 AM since he works for [employer] and is at work at 5 AM.  Since initiating CPAP,  he feels better.  He is rested when he awakens.  The past he had significant snoring on his back.  An Epworth Sleepiness Scale score was calculated in the office today and this endorsed at 12, consistent with mild residual daytime sleepiness as shown below.  Epworth Sleepiness Scale: Situation   Chance of Dozing/Sleeping (0 = never , 1 = slight chance , 2 = moderate chance , 3 = high chance )   sitting and reading 2   watching TV 2   sitting inactive in a public place 2   being a passenger in a motor vehicle for an hour or more 1   lying down in the afternoon 3   sitting and talking to someone 0   sitting quietly after lunch (no alcohol) 2   while stopped for a few minutes in traffic as the driver 0   Total Score  12       Past Medical History:  Diagnosis Date  . CAD (coronary artery disease) 03/2003   hx CABG X 5   . H/O cardiovascular stress test 2012   EF 76%, normal perfusion  . H/O echocardiogram 02/25/2009   EF >55%, suggestion of impaired LV relaxtion, tr MR  . Hx of myocardial  infarction 03/2003   cath-CABG  . Hyperlipidemia LDL goal <70   . Mild carotid artery disease ([place]) 08/12/2011   Lt carotid by ultrasound, 50-69%, rt 0-49%    Past Surgical History:  Procedure Laterality Date  . CARDIAC CATHETERIZATION    . CORONARY ARTERY BYPASS GRAFT  03/05/2003   LIMA-LAD; VG-PDA; VG-OM1; sequential VG-OM 2 and OM 3    Current Medications: Outpatient Medications Prior to Visit  Medication Sig Dispense Refill  . ALPRAZolam (XANAX) 1 MG tablet Take 1 tablet by mouth at bedtime.    [name] aspirin 81 MG tablet Take 81 mg by mouth daily.    [name] atorvastatin (LIPITOR) 80 MG tablet Take 1 tablet by mouth at bedtime.    [name] CIALIS 20 MG tablet Take 20 mg by mouth as needed.     . clopidogrel (PLAVIX) 75 MG tablet Take 75 mg by mouth daily with breakfast.    . co-enzyme Q-10 30 MG capsule Take 30 mg by mouth 3 (three) times daily.    . fish oil-omega-3 fatty acids 1000 MG capsule Take 2 g  by mouth daily.    [name] losartan (COZAAR) 50 MG tablet Take 50 mg by mouth daily.    . metoprolol succinate (TOPROL-XL) 50 MG 24 hr tablet Take 75 mg by mouth daily. Take with or immediately following a meal.    . tamsulosin (FLOMAX) 0.4 MG CAPS capsule Take 0.4 mg by mouth daily.    . traMADol (ULTRAM) 50 MG tablet Take 1 tablet by mouth as needed.     No facility-administered medications prior to visit.      Allergies:   Milk-related compounds   Social History   Social History  . Marital status: Married    Spouse name: N/A  . Number of children: N/A  . Years of education: N/A   Social History Main Topics  . Smoking status: Former Smoker    Types: Cigarettes    Quit date: 02/01/2003  . Smokeless tobacco: Former Systems developer    Types: Chew  . Alcohol use 0.6 oz/week    1 Cans of beer per week  . Drug use: Unknown  . Sexual activity: Not on file   Other Topics Concern  . Not on file   Social History Narrative  . No narrative on file     Family History:  The patient's family history includes Heart disease in his mother; Stroke (age of onset: 3) in his mother; Sudden death in his father.   ROS General: Negative; No fevers, chills, or night sweats;  HEENT: Negative; No changes in vision or hearing, sinus congestion, difficulty swallowing Pulmonary: Negative; No cough, wheezing, shortness of breath, hemoptysis Cardiovascular: Negative; No chest pain, presyncope, syncope, palpitations GI: Negative; No nausea, vomiting, diarrhea, or abdominal pain GU: Negative; No dysuria, hematuria, or difficulty voiding Musculoskeletal: Negative; no myalgias, joint pain, or weakness Hematologic/Oncology: Negative; no easy bruising, bleeding Endocrine: Negative; no heat/cold intolerance; no diabetes Neuro: Negative; no changes in balance, headaches Skin: Negative; No rashes or skin lesions Psychiatric: Negative; No behavioral problems, depression Sleep: See above, now on CPAP therapy for OSA .  Mild  residual daytime sleepiness, no bruxism, restless legs, hypnogognic hallucinations, no cataplexy Other comprehensive 14 point system review is negative.   PHYSICAL EXAM:   VS:  BP 120/83 (BP Location: Right Arm, Patient Position: Sitting, Cuff Size: Normal)   Ht 5\' 7"  (1.702 m)   Wt 187 lb (84.8 kg)   SpO2 97%   BMI 29.29  kg/m     Wt Readings from Last 3 Encounters:  03/23/16 187 lb (84.8 kg)  12/11/15 184 lb (83.5 kg)  11/06/15 184 lb 6.4 oz (83.6 kg)    General: Alert, oriented, no distress.  Skin: normal turgor, no rashes, warm and dry HEENT: Normocephalic, atraumatic. Pupils equal round and reactive to light; sclera anicteric; extraocular muscles intact; Fundi no hemorrhages or exudates.  Disc flat. Nose without nasal septal hypertrophy Mouth/Parynx benign; Mallinpatti scale 3 Neck: No JVD, no carotid bruits; normal carotid upstroke Lungs: clear to ausculatation and percussion; no wheezing or rales Chest wall: without tenderness to palpitation Heart: PMI not displaced, RRR, s1 s2 normal, faint 1/6 systolic murmur, no diastolic murmur, no rubs, gallops, thrills, or heaves Abdomen: soft, nontender; no hepatosplenomehaly, BS+; abdominal aorta nontender and not dilated by palpation. Back: no CVA tenderness Pulses 2+ Musculoskeletal: full range of motion, normal strength, no joint deformities Extremities: no clubbing cyanosis or edema, Homan's sign negative  Neurologic: grossly nonfocal; Cranial nerves grossly wnl Psychologic: Normal mood and affect   Studies/Labs Reviewed:   EKG:  EKG is not ordered today.  I have personally reviewed his last ECG from 11/06/2015 which showed normal sinus rhythm at 67 bpm.  There was no ectopy.  There were normal intervals.  Recent Labs: No flowsheet data Jackson.   No flowsheet data Jackson.  No flowsheet data Jackson. No results Jackson for: MCV No results Jackson for: TSH No results Jackson for: HGBA1C   BNP No results Jackson for:  BNP  ProBNP No results Jackson for: PROBNP   Lipid Panel  No results Jackson for: CHOL, TRIG, HDL, CHOLHDL, VLDL, LDLCALC, LDLDIRECT   RADIOLOGY: No results Jackson.   Additional studies/ records that were reviewed today include:  Office notes from Dr. Gwenlyn Jackson.  Sleep study, download data.    ASSESSMENT:    1. OSA (obstructive sleep apnea)   2. Snoring   3. Coronary artery disease involving native coronary artery of native heart without angina pectoris   4. Mild carotid artery disease ([place])   5. Hyperlipidemia LDL goal <70      PLAN:  Mr. Meer Schuerger is a 61 year old gentleman who has a history of prior CABG revascularization surgery and mild left internal carotid artery stenosis who recently was Jackson to have moderate sleep apnea on diagnostic polysomnogram.  I reviewed his sleep study in detail with him.  Although he had moderate sleep apnea.  Overall, sleep apnea was severe during Rehm sleep with an AHI of 43.0 per hour.  In addition, he had significant oxygen desaturation during the diagnostic study.  Over the past 6 weeks he has been on CPAP therapy and has been on a set pressure of 16 cm.  He is meeting compliance standards on his most recent download, but his AHI remains elevated at 8.8, which may be contributing to some of his residual daytime sleepiness.  In addition, he is only sleeping only 6 hours and 5 minutes, which is inadequate sleep duration.  I discussed with him.  Optimal sleep for an adult is 7-8 hours per night.  This has been difficult for him since he gets up at 3:50 AM to be at work at 5 AM.  I am now changing his CPAP mode to an auto mode with a range from a minimum of 12 up to 20/70 to water pressure.  This should really not resolve any of his recent apnea, which may still be occurring when his pressure was set at  a maximum of 16.  Clinically, he feels significantly improved.  I discussed with him the importance of continued compliance with CPAP.  In review, data  regarding adverse effects on cardiovascular health if his sleep apnea is left untreated.  I will see him on an as-needed basis from a sleep perspective.   Medication Adjustments/Labs and Tests Ordered: Current medicines are reviewed at length with the patient today.  Concerns regarding medicines are outlined above.  Medication changes, Labs and Tests ordered today are listed in the Patient Instructions below. Patient Instructions  Your physician recommends that you schedule a follow-up appointment if needed for sleep with Dr Claiborne Billings.     Signed, Jorge Majestic, MD  03/30/2016 1:56 PM    [facility] [address], [place], [place], [place]  [address] Phone: [phone]

## 2016-04-11 DIAGNOSIS — G4733 Obstructive sleep apnea (adult) (pediatric): Secondary | ICD-10-CM | POA: Diagnosis not present

## 2016-05-10 DIAGNOSIS — D242 Benign neoplasm of left breast: Secondary | ICD-10-CM | POA: Diagnosis not present

## 2016-05-12 DIAGNOSIS — G4733 Obstructive sleep apnea (adult) (pediatric): Secondary | ICD-10-CM | POA: Diagnosis not present

## 2016-06-11 DIAGNOSIS — G4733 Obstructive sleep apnea (adult) (pediatric): Secondary | ICD-10-CM | POA: Diagnosis not present

## 2016-07-12 DIAGNOSIS — G4733 Obstructive sleep apnea (adult) (pediatric): Secondary | ICD-10-CM | POA: Diagnosis not present

## 2016-08-11 DIAGNOSIS — G4733 Obstructive sleep apnea (adult) (pediatric): Secondary | ICD-10-CM | POA: Diagnosis not present

## 2016-08-19 DIAGNOSIS — E663 Overweight: Secondary | ICD-10-CM | POA: Diagnosis not present

## 2016-08-19 DIAGNOSIS — M62838 Other muscle spasm: Secondary | ICD-10-CM | POA: Diagnosis not present

## 2016-08-19 DIAGNOSIS — Z1389 Encounter for screening for other disorder: Secondary | ICD-10-CM | POA: Diagnosis not present

## 2016-08-19 DIAGNOSIS — Z0000 Encounter for general adult medical examination without abnormal findings: Secondary | ICD-10-CM | POA: Diagnosis not present

## 2016-08-19 DIAGNOSIS — E785 Hyperlipidemia, unspecified: Secondary | ICD-10-CM | POA: Diagnosis not present

## 2016-09-11 DIAGNOSIS — G4733 Obstructive sleep apnea (adult) (pediatric): Secondary | ICD-10-CM | POA: Diagnosis not present

## 2016-09-12 DIAGNOSIS — Z87448 Personal history of other diseases of urinary system: Secondary | ICD-10-CM | POA: Diagnosis not present

## 2016-10-12 DIAGNOSIS — G4733 Obstructive sleep apnea (adult) (pediatric): Secondary | ICD-10-CM | POA: Diagnosis not present

## 2016-11-11 DIAGNOSIS — G4733 Obstructive sleep apnea (adult) (pediatric): Secondary | ICD-10-CM | POA: Diagnosis not present

## 2016-12-02 ENCOUNTER — Encounter: Payer: Self-pay | Admitting: Cardiovascular Disease

## 2016-12-02 ENCOUNTER — Ambulatory Visit (INDEPENDENT_AMBULATORY_CARE_PROVIDER_SITE_OTHER): Payer: BLUE CROSS/BLUE SHIELD | Admitting: Cardiovascular Disease

## 2016-12-02 VITALS — BP 115/70 | HR 73 | Ht 67.0 in | Wt 183.2 lb

## 2016-12-02 DIAGNOSIS — E785 Hyperlipidemia, unspecified: Secondary | ICD-10-CM | POA: Diagnosis not present

## 2016-12-02 DIAGNOSIS — I2510 Atherosclerotic heart disease of native coronary artery without angina pectoris: Secondary | ICD-10-CM | POA: Diagnosis not present

## 2016-12-02 NOTE — Assessment & Plan Note (Signed)
History of CAD status post bypass grafting 5 by Dr. Prescott Gum February 2005 the LIMA to the LAD, vein to the PDA, OM1 and to sequentially Myoview performed 01/20/11 was nonischemic. He denies chest pain or shortness of breath.

## 2016-12-02 NOTE — Patient Instructions (Signed)

## 2016-12-02 NOTE — Progress Notes (Signed)
12/02/2016 Jorge Jackson   1955/04/06  [id number]  Primary Physician [name]., MD Primary Cardiologist: Lorretta Harp MD [name], [place], [place]  HPI:  Jorge Jackson is a 61 y.o. male mildly overweight married Caucasian male father of 48, grandfather to 1 grandson who I last saw 11/06/15.[name] He lost his job at the end of 2011. He worked at the [employer] in [place] which was outsourced to [place]. He was then hired by a [place] in March of last year in [place] where he still works. He had coronary artery bypass grafting in February of 2005 by Dr. Tharon Aquas Trigt with a LIMA to his LAD, a vein to a PDA, OM1 and OM2 sequentially. He is totally asymptomatic. He has mild left internal carotid artery stenosis which we follow by duplex ultrasound. Dr. Micheal Likens follows his lipid profile. His last Myoview performed January 20, 2011 was nonischemic He does complain of lethargy and fatigue and relates symptoms compatible with obstructive sleep apnea. He does wear his CPAP. Since I saw him one year ago, he remained completely asymptomatic denying chest pain or shortness of breath.  Current Meds  Medication Sig  . ALPRAZolam (XANAX) 1 MG tablet Take 1 tablet by mouth at bedtime.  [name] aspirin 81 MG tablet Take 81 mg by mouth daily.  [name] atorvastatin (LIPITOR) 80 MG tablet Take 1 tablet by mouth at bedtime.  [name] CIALIS 20 MG tablet Take 20 mg by mouth as needed.   . clopidogrel (PLAVIX) 75 MG tablet Take 75 mg by mouth daily with breakfast.  . co-enzyme Q-10 30 MG capsule Take 30 mg by mouth 3 (three) times daily.  . fish oil-omega-3 fatty acids 1000 MG capsule Take 2 g by mouth daily.  [name] losartan (COZAAR) 50 MG tablet Take 50 mg by mouth daily.  . metoprolol succinate (TOPROL-XL) 50 MG 24 hr tablet Take 75 mg by mouth daily. Take with or immediately following a meal.  . tamsulosin (FLOMAX) 0.4 MG CAPS capsule Take 0.4 mg by mouth daily.  . traMADol (ULTRAM) 50 MG tablet Take 1 tablet by mouth as  needed.     Allergies  Allergen Reactions  . Milk-Related Compounds Other (See Comments)    GETS GAS     Social History   Social History  . Marital status: Married    Spouse name: N/A  . Number of children: N/A  . Years of education: N/A   Occupational History  . Not on file.   Social History Main Topics  . Smoking status: Former Smoker    Types: Cigarettes    Quit date: 02/01/2003  . Smokeless tobacco: Former Systems developer    Types: Chew  . Alcohol use 0.6 oz/week    1 Cans of beer per week  . Drug use: Unknown  . Sexual activity: Not on file   Other Topics Concern  . Not on file   Social History Narrative  . No narrative on file     Review of Systems: General: negative for chills, fever, night sweats or weight changes.  Cardiovascular: negative for chest pain, dyspnea on exertion, edema, orthopnea, palpitations, paroxysmal nocturnal dyspnea or shortness of breath Dermatological: negative for rash Respiratory: negative for cough or wheezing Urologic: negative for hematuria Abdominal: negative for nausea, vomiting, diarrhea, bright red blood per rectum, melena, or hematemesis Neurologic: negative for visual changes, syncope, or dizziness All other systems reviewed and are otherwise negative except as noted above.    Blood  pressure 115/70, pulse 73, height 5\' 7"  (1.702 m), weight 183 lb 3.2 oz (83.1 kg).  General appearance: alert and no distress Neck: no adenopathy, no carotid bruit, no JVD, supple, symmetrical, trachea midline and thyroid not enlarged, symmetric, no tenderness/mass/nodules Lungs: clear to auscultation bilaterally Heart: regular rate and rhythm, S1, S2 normal, no murmur, click, rub or gallop Extremities: extremities normal, atraumatic, no cyanosis or edema Pulses: 2+ and symmetric Skin: Skin color, texture, turgor normal. No rashes or lesions Neurologic: Alert and oriented X 3, normal strength and tone. Normal symmetric reflexes. Normal coordination and  gait  EKG sinus rhythm at 73 without ST or T-wave changes. I personally reviewed this EKG.  ASSESSMENT AND PLAN:   CAD CABG X 5 '05. Low risk Myoview 12/12 History of CAD status post bypass grafting 5 by Dr. Prescott Gum February 2005 the LIMA to the LAD, vein to the PDA, OM1 and to sequentially Myoview performed 01/20/11 was nonischemic. He denies chest pain or shortness of breath.  Hyperlipidemia LDL goal <70 History of hyperlipidemia on statin therapy followed by his PCP      Lorretta Harp MD [facility], [facility] 12/02/2016 3:50 PM

## 2016-12-02 NOTE — Assessment & Plan Note (Signed)
History of hyperlipidemia on statin therapy followed by his PCP 

## 2016-12-12 DIAGNOSIS — G4733 Obstructive sleep apnea (adult) (pediatric): Secondary | ICD-10-CM | POA: Diagnosis not present

## 2017-01-11 DIAGNOSIS — G4733 Obstructive sleep apnea (adult) (pediatric): Secondary | ICD-10-CM | POA: Diagnosis not present

## 2017-01-18 DIAGNOSIS — G4733 Obstructive sleep apnea (adult) (pediatric): Secondary | ICD-10-CM | POA: Diagnosis not present

## 2017-02-11 DIAGNOSIS — G4733 Obstructive sleep apnea (adult) (pediatric): Secondary | ICD-10-CM | POA: Diagnosis not present

## 2017-02-23 DIAGNOSIS — M1990 Unspecified osteoarthritis, unspecified site: Secondary | ICD-10-CM | POA: Diagnosis not present

## 2017-02-23 DIAGNOSIS — F419 Anxiety disorder, unspecified: Secondary | ICD-10-CM | POA: Diagnosis not present

## 2017-02-23 DIAGNOSIS — E559 Vitamin D deficiency, unspecified: Secondary | ICD-10-CM | POA: Diagnosis not present

## 2017-02-23 DIAGNOSIS — E785 Hyperlipidemia, unspecified: Secondary | ICD-10-CM | POA: Diagnosis not present

## 2017-05-04 DIAGNOSIS — G4733 Obstructive sleep apnea (adult) (pediatric): Secondary | ICD-10-CM | POA: Diagnosis not present

## 2017-08-24 DIAGNOSIS — E785 Hyperlipidemia, unspecified: Secondary | ICD-10-CM | POA: Diagnosis not present

## 2017-08-24 DIAGNOSIS — E559 Vitamin D deficiency, unspecified: Secondary | ICD-10-CM | POA: Diagnosis not present

## 2017-08-24 DIAGNOSIS — Z23 Encounter for immunization: Secondary | ICD-10-CM | POA: Diagnosis not present

## 2017-08-24 DIAGNOSIS — M1990 Unspecified osteoarthritis, unspecified site: Secondary | ICD-10-CM | POA: Diagnosis not present

## 2017-08-24 DIAGNOSIS — Z79899 Other long term (current) drug therapy: Secondary | ICD-10-CM | POA: Diagnosis not present

## 2017-11-07 DIAGNOSIS — G4733 Obstructive sleep apnea (adult) (pediatric): Secondary | ICD-10-CM | POA: Diagnosis not present

## 2018-02-19 DIAGNOSIS — G4733 Obstructive sleep apnea (adult) (pediatric): Secondary | ICD-10-CM | POA: Diagnosis not present

## 2018-02-26 ENCOUNTER — Encounter: Payer: Self-pay | Admitting: Cardiovascular Disease

## 2018-02-26 DIAGNOSIS — Z79899 Other long term (current) drug therapy: Secondary | ICD-10-CM | POA: Diagnosis not present

## 2018-02-26 DIAGNOSIS — Z0000 Encounter for general adult medical examination without abnormal findings: Secondary | ICD-10-CM | POA: Diagnosis not present

## 2018-02-26 DIAGNOSIS — N401 Enlarged prostate with lower urinary tract symptoms: Secondary | ICD-10-CM | POA: Diagnosis not present

## 2018-02-26 DIAGNOSIS — Z23 Encounter for immunization: Secondary | ICD-10-CM | POA: Diagnosis not present

## 2018-03-07 DIAGNOSIS — R6889 Other general symptoms and signs: Secondary | ICD-10-CM | POA: Diagnosis not present

## 2018-03-07 DIAGNOSIS — R8290 Unspecified abnormal findings in urine: Secondary | ICD-10-CM | POA: Diagnosis not present

## 2018-03-07 DIAGNOSIS — Z6830 Body mass index (BMI) 30.0-30.9, adult: Secondary | ICD-10-CM | POA: Diagnosis not present

## 2018-03-07 DIAGNOSIS — N390 Urinary tract infection, site not specified: Secondary | ICD-10-CM | POA: Diagnosis not present

## 2018-03-07 DIAGNOSIS — R509 Fever, unspecified: Secondary | ICD-10-CM | POA: Diagnosis not present

## 2018-05-22 DIAGNOSIS — G4733 Obstructive sleep apnea (adult) (pediatric): Secondary | ICD-10-CM | POA: Diagnosis not present

## 2018-08-23 DIAGNOSIS — G4733 Obstructive sleep apnea (adult) (pediatric): Secondary | ICD-10-CM | POA: Diagnosis not present

## 2018-08-29 DIAGNOSIS — Z6828 Body mass index (BMI) 28.0-28.9, adult: Secondary | ICD-10-CM | POA: Diagnosis not present

## 2018-08-29 DIAGNOSIS — E785 Hyperlipidemia, unspecified: Secondary | ICD-10-CM | POA: Diagnosis not present

## 2018-08-29 DIAGNOSIS — F419 Anxiety disorder, unspecified: Secondary | ICD-10-CM | POA: Diagnosis not present

## 2018-08-29 DIAGNOSIS — N401 Enlarged prostate with lower urinary tract symptoms: Secondary | ICD-10-CM | POA: Diagnosis not present

## 2018-09-19 ENCOUNTER — Ambulatory Visit: Payer: BC Managed Care – PPO | Admitting: Cardiovascular Disease

## 2018-09-19 ENCOUNTER — Other Ambulatory Visit: Payer: Self-pay

## 2018-09-19 ENCOUNTER — Encounter: Payer: Self-pay | Admitting: Cardiovascular Disease

## 2018-09-19 VITALS — BP 116/70 | HR 77 | Temp 98.4°F | Ht 67.0 in | Wt 167.0 lb

## 2018-09-19 DIAGNOSIS — I779 Disorder of arteries and arterioles, unspecified: Secondary | ICD-10-CM

## 2018-09-19 DIAGNOSIS — I2510 Atherosclerotic heart disease of native coronary artery without angina pectoris: Secondary | ICD-10-CM

## 2018-09-19 DIAGNOSIS — Z008 Encounter for other general examination: Secondary | ICD-10-CM

## 2018-09-19 DIAGNOSIS — I739 Peripheral vascular disease, unspecified: Secondary | ICD-10-CM

## 2018-09-19 DIAGNOSIS — E785 Hyperlipidemia, unspecified: Secondary | ICD-10-CM

## 2018-09-19 NOTE — Progress Notes (Signed)
09/19/2018 Jorge Jackson   05/03/1955  [id number]  Primary Physician [name]., MD Primary Cardiologist: Lorretta Harp MD [name], [place], [place]  HPI:  Jorge Jackson is a 63 y.o.  mildly overweight married Caucasian male father of 67, grandfather to 1 grandson who I last saw  12/02/2016.[name] He lost his job at the end of 2011. He worked at the [employer] in [place] which was outsourced to [place]. 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 He had coronary artery bypass grafting in February of 2005 by Dr. Tharon Aquas Trigt with a LIMA to his LAD, a vein to a PDA, OM1 and OM2 sequentially. He is totally asymptomatic. He has mild left internal carotid artery stenosis which we follow by duplex ultrasound. Dr. Micheal Likens follows his lipid profile. His last Myoview performed January 20, 2011 was nonischemicHe does complain of lethargy and fatigue and relates symptoms compatible with obstructive sleep apnea. He does wear his CPAP.   Since I saw him 2 years ago he is done well from a cardiac point of view.  He denies chest pain or shortness of breath.  He does relate new onset left calf claudication over the last 8 to 12 months occurring at less than 100 yards.    Current Meds  Medication Sig  . ALPRAZolam (XANAX) 1 MG tablet Take 1 tablet by mouth at bedtime.  [name] aspirin 81 MG tablet Take 81 mg by mouth daily.  [name] atorvastatin (LIPITOR) 80 MG tablet Take 1 tablet by mouth at bedtime.  [name] CIALIS 20 MG tablet Take 20 mg by mouth as needed.   . clopidogrel (PLAVIX) 75 MG tablet Take 75 mg by mouth daily with breakfast.  . co-enzyme Q-10 30 MG capsule Take 30 mg by mouth 3 (three) times daily.  . fish oil-omega-3 fatty acids 1000 MG capsule Take 2 g by mouth daily.  [name] losartan (COZAAR) 50 MG tablet Take 50 mg by mouth daily.  . metoprolol succinate (TOPROL-XL) 50 MG 24 hr tablet Take 75 mg by mouth daily. Take with or immediately following a meal.   . tamsulosin (FLOMAX) 0.4 MG CAPS capsule Take 0.4 mg by mouth daily.  . traMADol (ULTRAM) 50 MG tablet Take 1 tablet by mouth as needed.     Allergies  Allergen Reactions  . Milk-Related Compounds Other (See Comments)    GETS GAS     Social History   Socioeconomic History  . Marital status: Married    Spouse name: Not on file  . Number of children: Not on file  . Years of education: Not on file  . Highest education level: Not on file  Occupational History  . Not on file  Social Needs  . Financial resource strain: Not on file  . Food insecurity    Worry: Not on file    Inability: Not on file  . Transportation needs    Medical: Not on file    Non-medical: Not on file  Tobacco Use  . Smoking status: Former Smoker    Types: Cigarettes    Quit date: 02/01/2003    Years since quitting: 15.6  . Smokeless tobacco: Former Systems developer    Types: Chew  Substance and Sexual Activity  . Alcohol use: Yes    Alcohol/week: 1.0 standard drinks    Types: 1 Cans of beer per week  . Drug use: Not on file  . Sexual  activity: Not on file  Lifestyle  . Physical activity    Days per week: Not on file    Minutes per session: Not on file  . Stress: Not on file  Relationships  . Social Herbalist on phone: Not on file    Gets together: Not on file    Attends religious service: Not on file    Active member of club or organization: Not on file    Attends meetings of clubs or organizations: Not on file    Relationship status: Not on file  . Intimate partner violence    Fear of current or ex partner: Not on file    Emotionally abused: Not on file    Physically abused: Not on file    Forced sexual activity: Not on file  Other Topics Concern  . Not on file  Social History Narrative  . Not on file     Review of Systems: General: negative for chills, fever, night sweats or weight changes.  Cardiovascular: negative for chest pain, dyspnea on exertion, edema, orthopnea, palpitations,  paroxysmal nocturnal dyspnea or shortness of breath Dermatological: negative for rash Respiratory: negative for cough or wheezing Urologic: negative for hematuria Abdominal: negative for nausea, vomiting, diarrhea, bright red blood per rectum, melena, or hematemesis Neurologic: negative for visual changes, syncope, or dizziness All other systems reviewed and are otherwise negative except as noted above.    Blood pressure 116/70, pulse 77, temperature 98.4 F (36.9 C), height 5\' 7"  (1.702 m), weight 167 lb (75.8 kg).  General appearance: alert and no distress Neck: no adenopathy, no JVD, supple, symmetrical, trachea midline, thyroid not enlarged, symmetric, no tenderness/mass/nodules and Soft right carotid bruit Lungs: clear to auscultation bilaterally Heart: regular rate and rhythm, S1, S2 normal, no murmur, click, rub or gallop Extremities: extremities normal, atraumatic, no cyanosis or edema Pulses: 2+ and symmetric Skin: Skin color, texture, turgor normal. No rashes or lesions Neurologic: Alert and oriented X 3, normal strength and tone. Normal symmetric reflexes. Normal coordination and gait  EKG sinus rhythm at 72 with low limb voltage, left axis deviation and inferior Q waves.  I personally reviewed this EKG.  ASSESSMENT AND PLAN:   CAD CABG X 5 '05. Low risk Myoview 12/12 History of CAD status post CABG by Dr. Darcey Nora February 05 with a LIMA to the LAD, vein to the PDA, and obtuse marginal branches 1 and 2 sequentially.  His last Myoview performed 01/20/2011 was nonischemic.  He denies chest pain or shortness of breath.  Hyperlipidemia LDL goal <70 History of hyperlipidemia on statin therapy with lipid profile performed 08/29/2018 revealing total cholesterol 115, LDL 42 and HDL 48  Claudication in peripheral vascular disease [facility]) Mr. Hurwitz has complained of left calf claudication which began 8 to 12 months ago.  He starts noticing it after walking 100 yards.  He does not have  symptoms in his right leg.  I am going to get lower extremity arterial Doppler studies to further evaluate  Mild carotid artery disease Right carotid bruit on exam with 50 to 69% stenosis left carotid, 0 to 49% right carotid by duplex ultrasound in 2013.  We will repeat carotid Doppler studies.      Lorretta Harp MD FACP,FACC,FAHA, [facility] 09/19/2018 9:31 AM

## 2018-09-19 NOTE — Patient Instructions (Addendum)
Medication Instructions:  Your physician recommends that you continue on your current medications as directed. Please refer to the Current Medication list given to you today.  If you need a refill on your cardiac medications before your next appointment, please call your pharmacy.   Lab work: NONE If you have labs (blood work) drawn today and your tests are completely normal, you will receive your results only by: [name] MyChart Message (if you have MyChart) OR . A paper copy in the mail If you have any lab test that is abnormal or we need to change your treatment, we will call you to review the results.  Testing/Procedures: Your physician has requested that you have a lower or upper extremity arterial duplex. This test is an ultrasound of the arteries in the legs or arms. It looks at arterial blood flow in the legs and arms. Allow one hour for Lower and Upper Arterial scans. There are no restrictions or special instructions TO BE SCHEDULED  Your physician has requested that you have an ankle brachial index (ABI). During this test an ultrasound and blood pressure cuff are used to evaluate the arteries that supply the arms and legs with blood. Allow thirty minutes for this exam. There are no restrictions or special instructions. TO BE SCHEDULED  Your physician has requested that you have a carotid duplex. This test is an ultrasound of the carotid arteries in your neck. It looks at blood flow through these arteries that supply the brain with blood. Allow one hour for this exam. There are no restrictions or special instructions. TO BE SCHEDULED   Follow-Up: At [facility], you and your health needs are our priority.  As part of our continuing mission to provide you with exceptional heart care, we have created designated Provider Care Teams.  These Care Teams include your primary Cardiologist (physician) and Advanced Practice Providers (APPs -  Physician Assistants and Nurse Practitioners) who all  work together to provide you with the care you need, when you need it. You will need a follow up appointment in 12 months with Dr. [name] unless your results are abnormal.  Please call our office 2 months in advance to schedule this appointment.

## 2018-09-19 NOTE — Assessment & Plan Note (Signed)
Right carotid bruit on exam with 50 to 69% stenosis left carotid, 0 to 49% right carotid by duplex ultrasound in 2013.  We will repeat carotid Doppler studies.

## 2018-09-19 NOTE — Assessment & Plan Note (Signed)
History of CAD status post CABG by Dr. Darcey Nora February 05 with a LIMA to the LAD, vein to the PDA, and obtuse marginal branches 1 and 2 sequentially.  His last Myoview performed 01/20/2011 was nonischemic.  He denies chest pain or shortness of breath.

## 2018-09-19 NOTE — Assessment & Plan Note (Signed)
Jorge Jackson has complained of left calf claudication which began 8 to 12 months ago.  He starts noticing it after walking 100 yards.  He does not have symptoms in his right leg.  I am going to get lower extremity arterial Doppler studies to further evaluate

## 2018-09-19 NOTE — Assessment & Plan Note (Signed)
History of hyperlipidemia on statin therapy with lipid profile performed 08/29/2018 revealing total cholesterol 115, LDL 42 and HDL 48

## 2018-10-11 ENCOUNTER — Other Ambulatory Visit: Payer: Self-pay | Admitting: Cardiovascular Disease

## 2018-10-11 ENCOUNTER — Inpatient Hospital Stay (HOSPITAL_COMMUNITY): Admission: RE | Admit: 2018-10-11 | Payer: BC Managed Care – PPO | Source: Ambulatory Visit

## 2018-10-11 ENCOUNTER — Ambulatory Visit (HOSPITAL_COMMUNITY)
Admission: RE | Admit: 2018-10-11 | Discharge: 2018-10-11 | Disposition: A | Discharge status: Home / Self Care | Payer: BC Managed Care – PPO | Source: Ambulatory Visit | Attending: Cardiology | Admitting: Cardiology

## 2018-10-11 ENCOUNTER — Ambulatory Visit (HOSPITAL_BASED_OUTPATIENT_CLINIC_OR_DEPARTMENT_OTHER)
Admission: RE | Admit: 2018-10-11 | Discharge: 2018-10-11 | Disposition: A | Discharge status: Home / Self Care | Payer: BC Managed Care – PPO | Source: Ambulatory Visit | Attending: Cardiovascular Disease | Admitting: Cardiovascular Disease

## 2018-10-11 ENCOUNTER — Other Ambulatory Visit: Payer: Self-pay

## 2018-10-11 DIAGNOSIS — R0989 Other specified symptoms and signs involving the circulatory and respiratory systems: Secondary | ICD-10-CM

## 2018-10-11 DIAGNOSIS — I739 Peripheral vascular disease, unspecified: Secondary | ICD-10-CM | POA: Diagnosis not present

## 2018-10-11 DIAGNOSIS — I779 Disorder of arteries and arterioles, unspecified: Secondary | ICD-10-CM

## 2018-10-12 ENCOUNTER — Telehealth: Payer: Self-pay

## 2018-10-12 ENCOUNTER — Telehealth: Payer: Self-pay | Admitting: *Deleted

## 2018-10-12 NOTE — Telephone Encounter (Signed)
PT AWARE OF LOWER ARTERIAL DUPLEX FINDINGS AND APPT MADE WITH DR [name] FOR 10/25/18 AT 2:00 PM./CY

## 2018-10-25 ENCOUNTER — Other Ambulatory Visit: Payer: Self-pay

## 2018-10-25 ENCOUNTER — Ambulatory Visit: Payer: BC Managed Care – PPO | Admitting: Cardiovascular Disease

## 2018-10-25 ENCOUNTER — Encounter: Payer: Self-pay | Admitting: Cardiovascular Disease

## 2018-10-25 DIAGNOSIS — I739 Peripheral vascular disease, unspecified: Secondary | ICD-10-CM

## 2018-10-25 NOTE — Progress Notes (Signed)
10/25/2018 Jorge Jackson   [birth year]  [id number]  Primary Physician [name]., MD Primary Cardiologist: Lorretta Harp MD Jorge Jackson, [name], [place]  HPI:  Jorge Jackson is a 63 y.o.  mildly overweight married Caucasian male father of 62, grandfather to 1 grandson who I last saw  09/19/2018.Jorge Jackson He lost his job at the end of 2011. 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 He had coronary artery bypass grafting in February of 2005 by Dr. Tharon Aquas Trigt with a LIMA to his LAD, a vein to a PDA, OM1 and OM2 sequentially. He is totally asymptomatic. He has mild left internal carotid artery stenosis which we follow by duplex ultrasound. Dr. Micheal Likens follows his lipid profile. His last Myoview performed January 20, 2011 was nonischemicHe does complain of lethargy and fatigue and relates symptoms compatible with obstructive sleep apnea.He does wear his CPAP.   Since I saw him 2 years ago he is done well from a cardiac point of view.  He denies chest pain or shortness of breath.  He does relate new onset left calf claudication over the last 8 to 12 months occurring at less than 100 yards.  I performed lower extremity angiography on him 10/11/2018 revealing a right ABI of 1.13 and a left of 0.7.  He did have a high-frequency signal in his mid left SFA and what appeared to be an occluded left tibioperoneal trunk.  He wishes to proceed with angiography and potential endovascular therapy.   Current Meds  Medication Sig  . ALPRAZolam (XANAX) 1 MG tablet Take 1 tablet by mouth at bedtime.  Jorge Jackson aspirin 81 MG tablet Take 81 mg by mouth daily.  Jorge Jackson atorvastatin (LIPITOR) 80 MG tablet Take 1 tablet by mouth at bedtime.  Jorge Jackson CIALIS 20 MG tablet Take 20 mg by mouth as needed.   . clopidogrel (PLAVIX) 75 MG tablet Take 75 mg by mouth daily with breakfast.  . co-enzyme Q-10 30 MG capsule  Take 30 mg by mouth 3 (three) times daily.  . fish oil-omega-3 fatty acids 1000 MG capsule Take 2 g by mouth daily.  Jorge Jackson losartan (COZAAR) 50 MG tablet Take 50 mg by mouth daily.  . metoprolol succinate (TOPROL-XL) 50 MG 24 hr tablet Take 75 mg by mouth daily. Take with or immediately following a meal.  . tamsulosin (FLOMAX) 0.4 MG CAPS capsule Take 0.4 mg by mouth daily.  . traMADol (ULTRAM) 50 MG tablet Take 1 tablet by mouth as needed.     Allergies  Allergen Reactions  . Milk-Related Compounds Other (See Comments)    GETS GAS     Social History   Socioeconomic History  . Marital status: Married    Spouse name: Not on file  . Number of children: Not on file  . Years of education: Not on file  . Highest education level: Not on file  Occupational History  . Not on file  Social Needs  . Financial resource strain: Not on file  . Food insecurity    Worry: Not on file    Inability: Not on file  . Transportation needs    Medical: Not on file    Non-medical: Not on file  Tobacco Use  . Smoking status: Former Smoker    Types: Cigarettes    Quit date: 02/01/2003    Years  since quitting: 15.7  . Smokeless tobacco: Former Systems developer    Types: Chew  Substance and Sexual Activity  . Alcohol use: Yes    Alcohol/week: 1.0 standard drinks    Types: 1 Cans of beer per week  . Drug use: Not on file  . Sexual activity: Not on file  Lifestyle  . Physical activity    Days per week: Not on file    Minutes per session: Not on file  . Stress: Not on file  Relationships  . Social Herbalist on phone: Not on file    Gets together: Not on file    Attends religious service: Not on file    Active member of club or organization: Not on file    Attends meetings of clubs or organizations: Not on file    Relationship status: Not on file  . Intimate partner violence    Fear of current or ex partner: Not on file    Emotionally abused: Not on file    Physically abused: Not on file     Forced sexual activity: Not on file  Other Topics Concern  . Not on file  Social History Narrative  . Not on file     Review of Systems: General: negative for chills, fever, night sweats or weight changes.  Cardiovascular: negative for chest pain, dyspnea on exertion, edema, orthopnea, palpitations, paroxysmal nocturnal dyspnea or shortness of breath Dermatological: negative for rash Respiratory: negative for cough or wheezing Urologic: negative for hematuria Abdominal: negative for nausea, vomiting, diarrhea, bright red blood per rectum, melena, or hematemesis Neurologic: negative for visual changes, syncope, or dizziness All other systems reviewed and are otherwise negative except as noted above.    Blood pressure 124/76, pulse 66, height 5\' 7"  (1.702 m), weight 188 lb (85.3 kg), SpO2 98 %.  General appearance: alert and no distress Neck: no adenopathy, no carotid bruit, no JVD, supple, symmetrical, trachea midline and thyroid not enlarged, symmetric, no tenderness/mass/nodules Lungs: clear to auscultation bilaterally Heart: regular rate and rhythm, S1, S2 normal, no murmur, click, rub or gallop Extremities: extremities normal, atraumatic, no cyanosis or edema Pulses: Diminished left pedal pulse Skin: Skin color, texture, turgor normal. No rashes or lesions Neurologic: Alert and oriented X 3, normal strength and tone. Normal symmetric reflexes. Normal coordination and gait  EKG not performed today  ASSESSMENT AND PLAN:   Claudication in peripheral vascular disease [facility]) Jorge Jackson returns today for follow-up of his Doppler studies performed 10/11/2018 in the evaluation of left calf claudication.  His right ABI was 1.13 and his left was 0.7.  He did have a high-frequency signal in his mid left SFA and occluded tibioperoneal trunk.  He wishes to proceed with angiography and potential endovascular therapy for lifestyle limiting claudication.      Lorretta Harp MD  FACP,FACC,FAHA, [facility] 10/25/2018 2:15 PM

## 2018-10-25 NOTE — Patient Instructions (Signed)
    [name] [name] Dept: [phone] Loc: [phone]  Jorge Jackson  10/25/2018  You are scheduled for a Peripheral Angiogram on Monday, October 5 with Dr. [name].  1. Please arrive at the [facility] (Main Entrance A) at [facility]: [address] [place], [place] [address] at 9:30 AM (This time is two hours before your procedure to ensure your preparation). Free valet parking service is available.   Special note: Every effort is made to have your procedure done on time. Please understand that emergencies sometimes delay scheduled procedures.  2. Diet: Do not eat solid foods after midnight.  The patient may have clear liquids until 5am upon the day of the procedure.  3. Labs:  You will need to have blood drawn TODAY  Go to:  [facility] at [facility] [id number], [place], [place] [address] FOR YOUR BASIC METABOLIC PANEL, COMPLETE BLOOD COUNT, AND THYROID STIMULATING HORMONE LAB WORK. NO APPOINTMENT IS NEEDED. YOU MUST HAVE THIS LAB WORK DONE BEFORE GOING TO GET YOUR COVID-19 TEST DONE BECAUSE YOU WILL NEED TO SELF-ISOLATE AFTER THE COVID-19 TEST UNTIL THE DAY OF YOUR [place].  You will need a COVID-19 test on 11/01/2018 AT 12:00pm Go to: [facility] Entrance [place], [place] [address] FOR YOUR COVID-19 TEST. YOU MUST HAVE YOUR COVID-19 TEST COMPLETED 4 DAYS PRIOR TO YOUR UPCOMING PROCEDURE/TEST. YOU WILL ALSO NEED TO SELF-ISOLATE AFTER THE COVID-19 TEST UNTIL THE DAY OF YOUR PROCEDURE/TEST. PLEASE BRING YOUR I.D. AND YOUR INSURANCE CARD(S) WITH YOU.   4. Medication instructions in preparation for your procedure:  On the morning of your procedure, take your Plavix/Clopidogrel and any morning medicines NOT listed above.  You may use sips of water.  5. Plan for one night stay--bring  personal belongings. 6. Bring a current list of your medications and current insurance cards. 7. You MUST have a responsible person to drive you home. 8. Someone MUST be with you the first 24 hours after you arrive home or your discharge will be delayed. 9. Please wear clothes that are easy to get on and off and wear slip-on shoes.  Thank you for allowing [place] to care for you!   -- [facility]  ___________________________________________________________________  Testing: Your physician has requested that you have an aorta/iliac duplex. During this test, an ultrasound is used to evaluate blood flow to the aorta and iliac arteries. Allow one hour for this exam. Do not eat after midnight the day before and avoid carbonated beverages. DUE AFTER YOUR PROCEDURE  Your physician has requested that you have an ankle brachial index (ABI). During this test an ultrasound and blood pressure cuff are used to evaluate the arteries that supply the arms and legs with blood. Allow thirty minutes for this exam. There are no restrictions or special instructions. DUE AFTER YOUR PROCEDURE   Follow-Up: At [facility], you and your health needs are our priority.  As part of our continuing mission to provide you with exceptional heart care, we have created designated Provider Care Teams.  These Care Teams include your primary Cardiologist (physician) and Advanced Practice Providers (APPs -  Physician Assistants and Nurse Practitioners) who all work together to provide you with the care you need, when you need it. You will need a follow up appointmentwith Dr. [name] AFTER YOUR ULTRASOUNDS.

## 2018-10-25 NOTE — H&P (View-Only) (Signed)
10/25/2018 Jorge Jackson   01-May-1955  [id number]  Primary Physician [name]., MD Primary Cardiologist: Lorretta Harp MD Jorge Jackson, [place], [place]  HPI:  Jorge Jackson is a 63 y.o.  mildly overweight married Caucasian male father of 43, grandfather to 1 grandson who I last Jorge  09/19/2018.[name] He lost his job at the end of 2011. He worked at the [employer] in [place] which was outsourced to [place]. He was then hired by a [employer] in March of 2017 in [place] and now works at [employer] bus.. He had coronary artery bypass grafting in February of 2005 by Dr. Tharon Aquas Trigt with a LIMA to his LAD, a vein to a PDA, OM1 and OM2 sequentially. He is totally asymptomatic. He has mild left internal carotid artery stenosis which we follow by duplex ultrasound. Dr. Micheal Likens follows his lipid profile. His last Myoview performed January 20, 2011 was nonischemicHe does complain of lethargy and fatigue and relates symptoms compatible with obstructive sleep apnea.He does wear his CPAP.   Since I Jorge him 2 years ago he is done well from a cardiac point of view.  He denies chest pain or shortness of breath.  He does relate new onset left calf claudication over the last 8 to 12 months occurring at less than 100 yards.  I performed lower extremity angiography on him 10/11/2018 revealing a right ABI of 1.13 and a left of 0.7.  He did have a high-frequency signal in his mid left SFA and what appeared to be an occluded left tibioperoneal trunk.  He wishes to proceed with angiography and potential endovascular therapy.   Current Meds  Medication Sig  . ALPRAZolam (XANAX) 1 MG tablet Take 1 tablet by mouth at bedtime.  [name] aspirin 81 MG tablet Take 81 mg by mouth daily.  [name] atorvastatin (LIPITOR) 80 MG tablet Take 1 tablet by mouth at bedtime.  [name] CIALIS 20 MG tablet Take 20 mg by mouth as needed.   . clopidogrel (PLAVIX) 75 MG tablet Take 75 mg by mouth daily with breakfast.  . co-enzyme Q-10 30 MG capsule  Take 30 mg by mouth 3 (three) times daily.  . fish oil-omega-3 fatty acids 1000 MG capsule Take 2 g by mouth daily.  [name] losartan (COZAAR) 50 MG tablet Take 50 mg by mouth daily.  . metoprolol succinate (TOPROL-XL) 50 MG 24 hr tablet Take 75 mg by mouth daily. Take with or immediately following a meal.  . tamsulosin (FLOMAX) 0.4 MG CAPS capsule Take 0.4 mg by mouth daily.  . traMADol (ULTRAM) 50 MG tablet Take 1 tablet by mouth as needed.     Allergies  Allergen Reactions  . Milk-Related Compounds Other (See Comments)    GETS GAS     Social History   Socioeconomic History  . Marital status: Married    Spouse name: Not on file  . Number of children: Not on file  . Years of education: Not on file  . Highest education level: Not on file  Occupational History  . Not on file  Social Needs  . Financial resource strain: Not on file  . Food insecurity    Worry: Not on file    Inability: Not on file  . Transportation needs    Medical: Not on file    Non-medical: Not on file  Tobacco Use  . Smoking status: Former Smoker    Types: Cigarettes    Quit date: 02/01/2003    Years  since quitting: 15.7  . Smokeless tobacco: Former Systems developer    Types: Chew  Substance and Sexual Activity  . Alcohol use: Yes    Alcohol/week: 1.0 standard drinks    Types: 1 Cans of beer per week  . Drug use: Not on file  . Sexual activity: Not on file  Lifestyle  . Physical activity    Days per week: Not on file    Minutes per session: Not on file  . Stress: Not on file  Relationships  . Social Herbalist on phone: Not on file    Gets together: Not on file    Attends religious service: Not on file    Active member of club or organization: Not on file    Attends meetings of clubs or organizations: Not on file    Relationship status: Not on file  . Intimate partner violence    Fear of current or ex partner: Not on file    Emotionally abused: Not on file    Physically abused: Not on file     Forced sexual activity: Not on file  Other Topics Concern  . Not on file  Social History Narrative  . Not on file     Review of Systems: General: negative for chills, fever, night sweats or weight changes.  Cardiovascular: negative for chest pain, dyspnea on exertion, edema, orthopnea, palpitations, paroxysmal nocturnal dyspnea or shortness of breath Dermatological: negative for rash Respiratory: negative for cough or wheezing Urologic: negative for hematuria Abdominal: negative for nausea, vomiting, diarrhea, bright red blood per rectum, melena, or hematemesis Neurologic: negative for visual changes, syncope, or dizziness All other systems reviewed and are otherwise negative except as noted above.    Blood pressure 124/76, pulse 66, height 5\' 7"  (1.702 m), weight 188 lb (85.3 kg), SpO2 98 %.  General appearance: alert and no distress Neck: no adenopathy, no carotid bruit, no JVD, supple, symmetrical, trachea midline and thyroid not enlarged, symmetric, no tenderness/mass/nodules Lungs: clear to auscultation bilaterally Heart: regular rate and rhythm, S1, S2 normal, no murmur, click, rub or gallop Extremities: extremities normal, atraumatic, no cyanosis or edema Pulses: Diminished left pedal pulse Skin: Skin color, texture, turgor normal. No rashes or lesions Neurologic: Alert and oriented X 3, normal strength and tone. Normal symmetric reflexes. Normal coordination and gait  EKG not performed today  ASSESSMENT AND PLAN:   Claudication in peripheral vascular disease [facility]) Mr. Fortuno returns today for follow-up of his Doppler studies performed 10/11/2018 in the evaluation of left calf claudication.  His right ABI was 1.13 and his left was 0.7.  He did have a high-frequency signal in his mid left SFA and occluded tibioperoneal trunk.  He wishes to proceed with angiography and potential endovascular therapy for lifestyle limiting claudication.      Lorretta Harp MD  FACP,FACC,FAHA, [facility] 10/25/2018 2:15 PM

## 2018-10-25 NOTE — Assessment & Plan Note (Signed)
Jorge Jackson returns today for follow-up of his Doppler studies performed 10/11/2018 in the evaluation of left calf claudication.  His right ABI was 1.13 and his left was 0.7.  He did have a high-frequency signal in his mid left SFA and occluded tibioperoneal trunk.  He wishes to proceed with angiography and potential endovascular therapy for lifestyle limiting claudication.

## 2018-10-26 LAB — CBC
Hematocrit: 40.7 % (ref 37.5–51.0)
Hemoglobin: 13.9 g/dL (ref 13.0–17.7)
MCH: 31.1 pg (ref 26.6–33.0)
MCHC: 34.2 g/dL (ref 31.5–35.7)
MCV: 91 fL (ref 79–97)
Platelets: 204 10*3/uL (ref 150–450)
RBC: 4.47 x10E6/uL (ref 4.14–5.80)
RDW: 11.8 % (ref 11.6–15.4)
WBC: 7.6 10*3/uL (ref 3.4–10.8)

## 2018-10-26 LAB — BASIC METABOLIC PANEL
BUN/Creatinine Ratio: 8 — ABNORMAL LOW (ref 10–24)
BUN: 8 mg/dL (ref 8–27)
CO2: 26 mmol/L (ref 20–29)
Calcium: 9.6 mg/dL (ref 8.6–10.2)
Chloride: 104 mmol/L (ref 96–106)
Creatinine, Ser: 0.95 mg/dL (ref 0.76–1.27)
GFR calc Af Amer: 99 mL/min/{1.73_m2} (ref >59)
GFR calc non Af Amer: 86 mL/min/{1.73_m2} (ref >59)
Glucose: 100 mg/dL — ABNORMAL HIGH (ref 65–99)
Potassium: 4.6 mmol/L (ref 3.5–5.2)
Sodium: 145 mmol/L — ABNORMAL HIGH (ref 134–144)

## 2018-11-01 ENCOUNTER — Telehealth: Payer: Self-pay | Admitting: *Deleted

## 2018-11-01 ENCOUNTER — Other Ambulatory Visit (HOSPITAL_COMMUNITY)
Admission: RE | Admit: 2018-11-01 | Discharge: 2018-11-01 | Disposition: A | Discharge status: Home / Self Care | Payer: BC Managed Care – PPO | Source: Ambulatory Visit | Attending: Cardiovascular Disease | Admitting: Cardiovascular Disease

## 2018-11-01 DIAGNOSIS — I739 Peripheral vascular disease, unspecified: Secondary | ICD-10-CM | POA: Diagnosis not present

## 2018-11-01 DIAGNOSIS — Z20828 Contact with and (suspected) exposure to other viral communicable diseases: Secondary | ICD-10-CM | POA: Insufficient documentation

## 2018-11-01 DIAGNOSIS — Z01812 Encounter for preprocedural laboratory examination: Secondary | ICD-10-CM | POA: Diagnosis not present

## 2018-11-01 NOTE — Telephone Encounter (Signed)
Pt contacted pre-abdominal aortogram scheduled at [facility] for: Monday November 05, 2018 11:30 AM Verified arrival time and place: [place] [facility]) at: 9:30 AM   No solid food after midnight prior to cath, clear liquids until 5 AM day of procedure. Contrast allergy: no  Hold: Cialis-48 hours pre procedure.  AM meds can be  taken pre-cath with sip of water including: ASA 81 mg Plavix 75 mg   Confirmed patient has responsible person to drive home post procedure and observe 24 hours after arriving home: yes  Currently, due to Covid-19 pandemic, only one support person will be allowed with patient. Must be the same support person for that patient's entire stay, will be screened and required to wear a mask. They will be asked to wait in the waiting room for the duration of the patient's stay.  Patients are required to wear a mask when they enter the hospital.     COVID-19 Pre-Screening Questions:  . In the past 7 to 10 days have you had a cough,  shortness of breath, headache, congestion, fever (100 or greater) body aches, chills, sore throat, or sudden loss of taste or sense of smell? no . Have you been around anyone with known Covid 19? no . Have you been around anyone who is awaiting Covid 19 test results in the past 7 to 10 days? no . Have you been around anyone who has been exposed to Covid 19, or has mentioned symptoms of Covid 19 within the past 7 to 10 days? no   I reviewed procedure/mask/visitor instructions, Covid-19 screening questions with patient, he verbalized understanding, thanked me for call.

## 2018-11-02 LAB — NOVEL CORONAVIRUS, NAA (HOSP ORDER, SEND-OUT TO REF LAB; TAT 18-24 HRS): SARS-CoV-2, NAA: NOT DETECTED

## 2018-11-05 ENCOUNTER — Encounter (HOSPITAL_COMMUNITY)
Admission: RE | Disposition: A | Discharge status: Home / Self Care | Payer: BC Managed Care – PPO | Source: Home / Self Care | Attending: Cardiovascular Disease

## 2018-11-05 ENCOUNTER — Other Ambulatory Visit: Payer: Self-pay

## 2018-11-05 ENCOUNTER — Ambulatory Visit (HOSPITAL_COMMUNITY)
Admission: RE | Admit: 2018-11-05 | Discharge: 2018-11-06 | Disposition: A | Discharge status: Home / Self Care | Payer: BC Managed Care – PPO | Attending: Cardiovascular Disease | Admitting: Cardiovascular Disease

## 2018-11-05 ENCOUNTER — Encounter (HOSPITAL_COMMUNITY): Payer: Self-pay | Admitting: *Deleted

## 2018-11-05 DIAGNOSIS — I70212 Atherosclerosis of native arteries of extremities with intermittent claudication, left leg: Secondary | ICD-10-CM | POA: Insufficient documentation

## 2018-11-05 DIAGNOSIS — E663 Overweight: Secondary | ICD-10-CM | POA: Insufficient documentation

## 2018-11-05 DIAGNOSIS — Z7982 Long term (current) use of aspirin: Secondary | ICD-10-CM | POA: Diagnosis not present

## 2018-11-05 DIAGNOSIS — E785 Hyperlipidemia, unspecified: Secondary | ICD-10-CM | POA: Diagnosis not present

## 2018-11-05 DIAGNOSIS — Z23 Encounter for immunization: Secondary | ICD-10-CM | POA: Insufficient documentation

## 2018-11-05 DIAGNOSIS — Z87891 Personal history of nicotine dependence: Secondary | ICD-10-CM | POA: Insufficient documentation

## 2018-11-05 DIAGNOSIS — Z7902 Long term (current) use of antithrombotics/antiplatelets: Secondary | ICD-10-CM | POA: Diagnosis not present

## 2018-11-05 DIAGNOSIS — I6523 Occlusion and stenosis of bilateral carotid arteries: Secondary | ICD-10-CM | POA: Diagnosis not present

## 2018-11-05 DIAGNOSIS — Z79899 Other long term (current) drug therapy: Secondary | ICD-10-CM | POA: Diagnosis not present

## 2018-11-05 DIAGNOSIS — Z951 Presence of aortocoronary bypass graft: Secondary | ICD-10-CM | POA: Insufficient documentation

## 2018-11-05 DIAGNOSIS — Z6828 Body mass index (BMI) 28.0-28.9, adult: Secondary | ICD-10-CM | POA: Diagnosis not present

## 2018-11-05 DIAGNOSIS — I739 Peripheral vascular disease, unspecified: Secondary | ICD-10-CM | POA: Diagnosis present

## 2018-11-05 HISTORY — PX: ABDOMINAL AORTOGRAM W/LOWER EXTREMITY: CATH118223

## 2018-11-05 HISTORY — DX: Peripheral vascular disease, unspecified: I73.9

## 2018-11-05 HISTORY — PX: PERIPHERAL VASCULAR INTERVENTION: CATH118257

## 2018-11-05 HISTORY — PX: PERIPHERAL VASCULAR ATHERECTOMY: CATH118256

## 2018-11-05 LAB — POCT ACTIVATED CLOTTING TIME
Activated Clotting Time: 290 seconds
Activated Clotting Time: 230 seconds
Activated Clotting Time: 169 seconds

## 2018-11-05 SURGERY — ABDOMINAL AORTOGRAM W/LOWER EXTREMITY
Anesthesia: LOCAL | Laterality: Bilateral

## 2018-11-05 MED ORDER — NITROGLYCERIN 1 MG/10 ML FOR IR/CATH LAB
INTRA_ARTERIAL | Status: DC | PRN
  Administered 2018-11-05 (×3): 200 ug via INTRA_ARTERIAL

## 2018-11-05 MED ORDER — NITROGLYCERIN IN D5W 200-5 MCG/ML-% IV SOLN
INTRAVENOUS | Status: AC
  Filled 2018-11-05: qty 250

## 2018-11-05 MED ORDER — HEPARIN (PORCINE) IN NACL 1000-0.9 UT/500ML-% IV SOLN
INTRAVENOUS | Status: DC | PRN
  Administered 2018-11-05 (×2): 500 mL

## 2018-11-05 MED ORDER — LIDOCAINE HCL (PF) 1 % IJ SOLN
INTRAMUSCULAR | Status: DC | PRN
  Administered 2018-11-05 (×2): 15 mL via INTRADERMAL

## 2018-11-05 MED ORDER — MIDAZOLAM HCL 2 MG/2ML IJ SOLN
INTRAMUSCULAR | Status: AC
  Filled 2018-11-05: qty 2

## 2018-11-05 MED ORDER — VIPERSLIDE LUBRICANT OPTIME
TOPICAL | Status: DC | PRN
  Administered 2018-11-05: 13:00:00 20 mL via SURGICAL_CAVITY

## 2018-11-05 MED ORDER — ASPIRIN EC 81 MG PO TBEC
81.0000 mg | DELAYED_RELEASE_TABLET | Freq: Every day | ORAL | Status: DC
  Administered 2018-11-06: 08:00:00 81 mg via ORAL
  Filled 2018-11-05: qty 1

## 2018-11-05 MED ORDER — MORPHINE SULFATE (PF) 2 MG/ML IV SOLN: 2.0000 mg | INTRAVENOUS | Status: DC | PRN

## 2018-11-05 MED ORDER — MIDAZOLAM HCL 2 MG/2ML IJ SOLN
INTRAMUSCULAR | Status: DC | PRN
  Administered 2018-11-05 (×2): 1 mg via INTRAVENOUS

## 2018-11-05 MED ORDER — HEPARIN (PORCINE) IN NACL 1000-0.9 UT/500ML-% IV SOLN
INTRAVENOUS | Status: AC
  Filled 2018-11-05: qty 1000

## 2018-11-05 MED ORDER — CLOPIDOGREL BISULFATE 75 MG PO TABS
75.0000 mg | ORAL_TABLET | Freq: Every day | ORAL | Status: DC
  Administered 2018-11-06: 08:00:00 75 mg via ORAL
  Filled 2018-11-05: qty 1

## 2018-11-05 MED ORDER — SODIUM CHLORIDE 0.9 % WEIGHT BASED INFUSION: 1.0000 mL/kg/h | INTRAVENOUS | Status: DC

## 2018-11-05 MED ORDER — SODIUM CHLORIDE 0.9% FLUSH: 3.0000 mL | Freq: Two times a day (BID) | INTRAVENOUS | Status: DC

## 2018-11-05 MED ORDER — ASPIRIN EC 81 MG PO TBEC: 81.0000 mg | DELAYED_RELEASE_TABLET | Freq: Every day | ORAL | Status: DC

## 2018-11-05 MED ORDER — IRBESARTAN 150 MG PO TABS
150.0000 mg | ORAL_TABLET | Freq: Every day | ORAL | Status: DC
  Administered 2018-11-06: 08:00:00 150 mg via ORAL
  Filled 2018-11-05: qty 1

## 2018-11-05 MED ORDER — SODIUM CHLORIDE 0.9 % IV SOLN: 250.0000 mL | INTRAVENOUS | Status: DC | PRN

## 2018-11-05 MED ORDER — SODIUM CHLORIDE 0.9% FLUSH: 3.0000 mL | INTRAVENOUS | Status: DC | PRN

## 2018-11-05 MED ORDER — FENTANYL CITRATE (PF) 100 MCG/2ML IJ SOLN
INTRAMUSCULAR | Status: AC
  Filled 2018-11-05: qty 2

## 2018-11-05 MED ORDER — NITROGLYCERIN 1 MG/10 ML FOR IR/CATH LAB
INTRA_ARTERIAL | Status: AC
  Filled 2018-11-05: qty 10

## 2018-11-05 MED ORDER — LIDOCAINE HCL (PF) 1 % IJ SOLN
INTRAMUSCULAR | Status: AC
  Filled 2018-11-05: qty 30

## 2018-11-05 MED ORDER — ROSUVASTATIN CALCIUM 20 MG PO TABS
40.0000 mg | ORAL_TABLET | Freq: Every day | ORAL | Status: DC
  Administered 2018-11-05 – 2018-11-06 (×2): 40 mg via ORAL
  Filled 2018-11-05 (×2): qty 2

## 2018-11-05 MED ORDER — SODIUM CHLORIDE 0.9 % WEIGHT BASED INFUSION
3.0000 mL/kg/h | INTRAVENOUS | Status: DC
  Administered 2018-11-05: 3 mL/kg/h via INTRAVENOUS

## 2018-11-05 MED ORDER — METOPROLOL SUCCINATE ER 50 MG PO TB24
75.0000 mg | ORAL_TABLET | Freq: Every day | ORAL | Status: DC
  Administered 2018-11-06: 75 mg via ORAL
  Filled 2018-11-05: qty 1

## 2018-11-05 MED ORDER — SODIUM CHLORIDE 0.9 % IV SOLN
INTRAVENOUS | Status: AC
  Administered 2018-11-05: 19:00:00 via INTRAVENOUS

## 2018-11-05 MED ORDER — HEPARIN SODIUM (PORCINE) 1000 UNIT/ML IJ SOLN
INTRAMUSCULAR | Status: AC
  Filled 2018-11-05: qty 1

## 2018-11-05 MED ORDER — ACETAMINOPHEN 325 MG PO TABS
ORAL_TABLET | ORAL | Status: AC
  Filled 2018-11-05: qty 2

## 2018-11-05 MED ORDER — TAMSULOSIN HCL 0.4 MG PO CAPS
0.4000 mg | ORAL_CAPSULE | Freq: Every evening | ORAL | Status: DC
  Administered 2018-11-05: 0.4 mg via ORAL
  Filled 2018-11-05: qty 1

## 2018-11-05 MED ORDER — LABETALOL HCL 5 MG/ML IV SOLN: 10.0000 mg | INTRAVENOUS | Status: DC | PRN

## 2018-11-05 MED ORDER — CLOPIDOGREL BISULFATE 75 MG PO TABS: 75.0000 mg | ORAL_TABLET | Freq: Every day | ORAL | Status: DC

## 2018-11-05 MED ORDER — ALPRAZOLAM 0.5 MG PO TABS
1.0000 mg | ORAL_TABLET | Freq: Every day | ORAL | Status: DC
  Administered 2018-11-05: 23:00:00 1 mg via ORAL
  Filled 2018-11-05: qty 2

## 2018-11-05 MED ORDER — TRAMADOL HCL 50 MG PO TABS
50.0000 mg | ORAL_TABLET | Freq: Every day | ORAL | Status: DC
  Administered 2018-11-05 – 2018-11-06 (×4): 50 mg via ORAL
  Filled 2018-11-05 (×4): qty 1

## 2018-11-05 MED ORDER — CLOPIDOGREL BISULFATE 75 MG PO TABS: 75.0000 mg | ORAL_TABLET | ORAL | Status: DC

## 2018-11-05 MED ORDER — ONDANSETRON HCL 4 MG/2ML IJ SOLN: 4.0000 mg | Freq: Four times a day (QID) | INTRAMUSCULAR | Status: DC | PRN

## 2018-11-05 MED ORDER — ASPIRIN 81 MG PO CHEW: 81.0000 mg | CHEWABLE_TABLET | ORAL | Status: DC

## 2018-11-05 MED ORDER — IODIXANOL 320 MG/ML IV SOLN
INTRAVENOUS | Status: DC | PRN
  Administered 2018-11-05: 190 mL via INTRA_ARTERIAL

## 2018-11-05 MED ORDER — ACETAMINOPHEN 325 MG PO TABS
650.0000 mg | ORAL_TABLET | ORAL | Status: DC | PRN
  Administered 2018-11-05: 17:00:00 650 mg via ORAL

## 2018-11-05 MED ORDER — HYDRALAZINE HCL 20 MG/ML IJ SOLN: 5.0000 mg | INTRAMUSCULAR | Status: DC | PRN

## 2018-11-05 MED ORDER — FENTANYL CITRATE (PF) 100 MCG/2ML IJ SOLN
INTRAMUSCULAR | Status: DC | PRN
  Administered 2018-11-05 (×2): 25 ug via INTRAVENOUS

## 2018-11-05 MED ORDER — HEPARIN SODIUM (PORCINE) 1000 UNIT/ML IJ SOLN
INTRAMUSCULAR | Status: DC | PRN
  Administered 2018-11-05: 8000 [IU] via INTRAVENOUS

## 2018-11-05 SURGICAL SUPPLY — 22 items
CATH ANGIO 5F PIGTAIL 65CM (CATHETERS) ×1 IMPLANT
CATH CROSS OVER TEMPO 5F (CATHETERS) ×1 IMPLANT
CATH STRAIGHT 5FR 65CM (CATHETERS) ×1 IMPLANT
DIAMONDBACK SOLID OAS 2.0MM (CATHETERS) ×3
GUIDEWIRE ANGLED .035X150CM (WIRE) ×1 IMPLANT
KIT ENCORE 26 ADVANTAGE (KITS) ×1 IMPLANT
KIT PV (KITS) ×3 IMPLANT
LUBRICANT VIPERSLIDE CORONARY (MISCELLANEOUS) ×2 IMPLANT
SHEATH BRITE TIP 7FR 35CM (SHEATH) ×1 IMPLANT
SHEATH PINNACLE 5F 10CM (SHEATH) ×1 IMPLANT
SHEATH PINNACLE 7F 10CM (SHEATH) ×1 IMPLANT
STENT VIABAHNBX 8X59X80 (Permanent Stent) ×1 IMPLANT
STOPCOCK MORSE 400PSI 3WAY (MISCELLANEOUS) ×1 IMPLANT
SYR MEDRAD MARK 7 150ML (SYRINGE) ×3 IMPLANT
SYSTEM DIMNDBCK SLD OAS 2.0MM (CATHETERS) IMPLANT
TAPE VIPERTRACK RADIOPAQ (MISCELLANEOUS) IMPLANT
TAPE VIPERTRACK RADIOPAQUE (MISCELLANEOUS) ×1
TRANSDUCER W/STOPCOCK (MISCELLANEOUS) ×3 IMPLANT
TRAY PV CATH (CUSTOM PROCEDURE TRAY) ×3 IMPLANT
TUBING CIL FLEX 10 FLL-RA (TUBING) ×1 IMPLANT
WIRE HITORQ VERSACORE ST 145CM (WIRE) ×2 IMPLANT
WIRE VIPER WIRECTO 0.014 (WIRE) ×1 IMPLANT

## 2018-11-05 NOTE — Interval H&P Note (Signed)
History and Physical Interval Note:  11/05/2018 12:31 PM  Jorge Jackson  has presented today for surgery, with the diagnosis of claudication.  The various methods of treatment have been discussed with the patient and family. After consideration of risks, benefits and other options for treatment, the patient has consented to  Procedure(s): ABDOMINAL AORTOGRAM W/LOWER EXTREMITY (Bilateral) as a surgical intervention.  The patient's history has been reviewed, patient examined, no change in status, stable for surgery.  I have reviewed the patient's chart and labs.  Questions were answered to the patient's satisfaction.     [name]

## 2018-11-05 NOTE — Progress Notes (Signed)
Site area: right and left groin  Site Prior to Removal:  Level 0  Pressure Applied For 30  MINUTES    Minutes Beginning at 11550   Manual:   Yes.    Patient Status During Pull:  stable  Post Pull Groin Site:  Level 0  Post Pull Instructions Given:  Yes.    Post Pull Pulses Present:  Yes.    Dressing Applied:  Yes.    Comments:   Bed rest started at 1630 X 6 hr.  BP 136/72 HR 66 SR

## 2018-11-06 ENCOUNTER — Encounter (HOSPITAL_COMMUNITY): Payer: Self-pay | Admitting: Cardiovascular Disease

## 2018-11-06 DIAGNOSIS — Z7982 Long term (current) use of aspirin: Secondary | ICD-10-CM | POA: Diagnosis not present

## 2018-11-06 DIAGNOSIS — Z6828 Body mass index (BMI) 28.0-28.9, adult: Secondary | ICD-10-CM | POA: Diagnosis not present

## 2018-11-06 DIAGNOSIS — Z951 Presence of aortocoronary bypass graft: Secondary | ICD-10-CM | POA: Diagnosis not present

## 2018-11-06 DIAGNOSIS — Z87891 Personal history of nicotine dependence: Secondary | ICD-10-CM | POA: Diagnosis not present

## 2018-11-06 DIAGNOSIS — E663 Overweight: Secondary | ICD-10-CM | POA: Diagnosis not present

## 2018-11-06 DIAGNOSIS — I70212 Atherosclerosis of native arteries of extremities with intermittent claudication, left leg: Secondary | ICD-10-CM | POA: Diagnosis not present

## 2018-11-06 DIAGNOSIS — Z23 Encounter for immunization: Secondary | ICD-10-CM | POA: Diagnosis not present

## 2018-11-06 DIAGNOSIS — E785 Hyperlipidemia, unspecified: Secondary | ICD-10-CM | POA: Diagnosis not present

## 2018-11-06 DIAGNOSIS — Z7902 Long term (current) use of antithrombotics/antiplatelets: Secondary | ICD-10-CM | POA: Diagnosis not present

## 2018-11-06 DIAGNOSIS — Z79899 Other long term (current) drug therapy: Secondary | ICD-10-CM | POA: Diagnosis not present

## 2018-11-06 DIAGNOSIS — I739 Peripheral vascular disease, unspecified: Secondary | ICD-10-CM

## 2018-11-06 DIAGNOSIS — I6523 Occlusion and stenosis of bilateral carotid arteries: Secondary | ICD-10-CM | POA: Diagnosis not present

## 2018-11-06 LAB — BASIC METABOLIC PANEL
Anion gap: 9 (ref 5–15)
BUN: 9 mg/dL (ref 8–23)
CO2: 24 mmol/L (ref 22–32)
Calcium: 8.8 mg/dL — ABNORMAL LOW (ref 8.9–10.3)
Chloride: 105 mmol/L (ref 98–111)
Creatinine, Ser: 0.82 mg/dL (ref 0.61–1.24)
GFR calc Af Amer: >60 mL/min (ref >60)
GFR calc non Af Amer: >60 mL/min (ref >60)
Glucose, Bld: 95 mg/dL (ref 70–99)
Potassium: 4 mmol/L (ref 3.5–5.1)
Sodium: 138 mmol/L (ref 135–145)

## 2018-11-06 LAB — CBC
HCT: 41.1 % (ref 39.0–52.0)
Hemoglobin: 14 g/dL (ref 13.0–17.0)
MCH: 31.2 pg (ref 26.0–34.0)
MCHC: 34.1 g/dL (ref 30.0–36.0)
MCV: 91.5 fL (ref 80.0–100.0)
Platelets: 192 10*3/uL (ref 150–400)
RBC: 4.49 MIL/uL (ref 4.22–5.81)
RDW: 11.9 % (ref 11.5–15.5)
WBC: 8.3 10*3/uL (ref 4.0–10.5)
nRBC: 0 % (ref 0.0–0.2)

## 2018-11-06 MED ORDER — INFLUENZA VAC SPLIT QUAD 0.5 ML IM SUSY
0.5000 mL | PREFILLED_SYRINGE | INTRAMUSCULAR | Status: AC
  Administered 2018-11-06: 12:00:00 0.5 mL via INTRAMUSCULAR

## 2018-11-06 NOTE — Discharge Summary (Addendum)
Discharge Summary    Patient ID: Jorge Jackson,  MRN: [id number], DOB/AGE: 61-Sep-1957 61 y.o.  Admit date: 11/05/2018 Discharge date: 11/06/2018  Primary Care Provider: [name] Primary Cardiologist: [name], MD   Discharge Diagnoses    Active Problems:   Claudication in peripheral vascular disease [facility])   Allergies Allergies  Allergen Reactions   Milk-Related Compounds Other (See Comments)    GETS GAS     Diagnostic Studies/Procedures    Abdominal aortogram with lower extremity runoff: 11/05/2018 Procedures Performed: 1. Ultrasound-guided right and left common femoral access 2. Abdominal aortogram, bilateral iliac angiogram/bifemoral runoff 3. Diamondback orbital rotational atherectomy left common iliac artery 4. VBX covered stenting left common iliac artery Angiographic Data:   1: Abdominal aorta-this abdominal aorta was free of atherosclerotic changes. The renal arteries appeared normal although the left renal artery arose apparently. 2: Left lower extremity-multiple calcific/exophytic intraluminal filling defects in the entire left common iliac artery. 95% tandem calcified mid and distal left SFA stenosis, calcified occluded left popliteal artery with one-vessel runoff via the anterior tibial 3: Right lower extremity-60% calcified ostial right common iliac artery stenosis, 95% calcified segmental mid right SFA stenosis with three-vessel runoff  IMPRESSION:Jorge Jackson has calcified exophytic plaques in his entire left iliac system as well as tandem high-grade calcific plaques in his mid and distal left SFA with an occluded left popliteal artery and one-vessel runoff. He has lifestyle and claudication. We will proceed with diamondback orbital rotational atherectomy of the entire left iliac artery followed by VBX covered stenting. He will need staged left SFA plus or minus popliteal  intervention.  Procedure Description: The patient received a total of 8 thousandths of heparin with an ACT of 230. A total of 190 cc of contrast was administered the patient. The 5 French sheath was exchanged for a 7 French bright tip sheath on the left side. I placed an 014 Viper wire across the diseased segment into the mid abdominal aorta. I then use it used a 2.0 mm solid bur and performed rotational atherectomy of the entire left iliac system of 220,000 RPMs for 3 runs getting copious amounts of intra-arterial nitroglycerin. I then exchanged the Viper wire through an endhole catheter for an 035 versa core wire and carefully positioned a six 8 mm x 59 mm long VBX covered stent across the treated/diseased segment. I deployed at 10 atm resulting reduction of high-grade calcified segmental left common iliac artery stenosis to 0% residual. Patient tolerated procedure well. The bright tip sheath was then exchanged over the wire for a short 7 [place] sheath. Both sheaths were secured in place. Patient left lab in stable condition.  Final Impression:Successful diamondback orbital rotational atherectomy, VBX covered stenting of a highly calcified segmental left common iliac artery stenosis. Patient has residual tandem high-grade calcified mid and distal left SFA stenoses as well as an occluded left popliteal artery which was calcified as well with one-vessel runoff. He will need staged intervention of his left SFA proximal popliteal artery. The sheath will be removed once ACT falls below 170 and pressure held. Patient be gently hydrated overnight and discharged home in the morning. We will obtain lower extremity arterial Doppler studies in our [facility] line office next week and I will see him back the week after for follow-up. _____________   History of Present Illness     63 y.o. male w/ hx CABG 2005 w/ LIMA-LAD, SVG-PDA-OM1-OM2, MV 2012 ok, HLD, carotid dz, PAD. Admitted 10/05 for LE angio,  done  for claudication sx and abnl ABIs.   Hospital Course     Consultants: None  Angiogram results are above.  He had a VBX stent to the left common iliac artery.  However he still has left SFA disease and an occluded left popliteal.  He tolerated the procedure well, no immediate complications.  On 10/6, he was seen by Dr. Debara Pickett and all data were reviewed.  He was doing well from a cardiac standpoint.  Pulses in his left lower extremity could be dopplered.  His ambulation had improved.  No further inpatient work-up is indicated and he is considered stable for discharge, with outpatient follow-up arranged.` _____________  Discharge Vitals Blood pressure 133/73, pulse 71, temperature (!) 97.5 F (36.4 C), temperature source Oral, resp. rate 16, height _0  (1.702 m), weight 83.3 kg, SpO2 100 %.  Filed Weights   11/05/18 0909 11/06/18 0641  Weight: 83.9 kg 83.3 kg    Labs & Radiologic Studies    CBC Recent Labs    11/06/18 0327  WBC 8.3  HGB 14.0  HCT 41.1  MCV 91.5  PLT 758   Basic Metabolic Panel Recent Labs    11/06/18 0327  NA 138  K 4.0  CL 105  CO2 24  GLUCOSE 95  BUN 9  CREATININE 0.82  CALCIUM 8.8*   _____________  Disposition   Pt is being discharged home today in good condition.  Follow-up Plans & Appointments    Follow-up Information    Lorretta Harp, MD Follow up.   Specialties: Cardiology, Radiology Why: Keep appointments for imaging studies on 10/20 and follow-up with Dr. [name] as scheduled on 10/27. Contact information: [address] [place] [address] [phone]          Discharge Instructions    Diet - low sodium heart healthy   Complete by: As directed    Increase activity slowly   Complete by: As directed       Discharge Medications   Allergies as of 11/06/2018      Reactions   Milk-related Compounds Other (See Comments)   GETS GAS       Medication List    TAKE these medications   ALPRAZolam 1 MG  tablet Commonly known as: XANAX Take 1 tablet by mouth at bedtime.   aspirin EC 81 MG tablet Take 81 mg by mouth daily.   Cialis 20 MG tablet Generic drug: tadalafil Take 20 mg by mouth daily as needed for erectile dysfunction.   clopidogrel 75 MG tablet Commonly known as: PLAVIX Take 75 mg by mouth daily with breakfast.   metoprolol succinate 50 MG 24 hr tablet Commonly known as: TOPROL-XL Take 75 mg by mouth daily. Take with or immediately following a meal.   olmesartan 20 MG tablet Commonly known as: BENICAR Take 20 mg by mouth daily.   OMEGA 3 PO Take 950 mg by mouth 2 (two) times daily.   rosuvastatin 40 MG tablet Commonly known as: CRESTOR Take 40 mg by mouth daily.   SAW PALMETTO PO Take 1 capsule by mouth every evening.   tamsulosin 0.4 MG Caps capsule Commonly known as: FLOMAX Take 0.4 mg by mouth every evening.   traMADol 50 MG tablet Commonly known as: ULTRAM Take 50 mg by mouth 5 (five) times daily.   ULTRA COQ10 PO Take 1 capsule by mouth daily.           Outstanding Labs/Studies   None  Duration of Discharge  Encounter   Greater than 30 minutes including physician time.  Signed, Suanne Marker Daeshawn Redmann PA-C 11/06/2018, 10:48 AM

## 2018-11-06 NOTE — Discharge Instructions (Signed)
PLEASE REMEMBER TO BRING ALL OF YOUR MEDICATIONS TO EACH OF YOUR FOLLOW-UP OFFICE VISITS. ° °PLEASE ATTEND ALL SCHEDULED FOLLOW-UP APPOINTMENTS.  ° °Activity: Increase activity slowly as tolerated. You may shower, but no soaking baths (or swimming) for 1 week. No driving for 2 days. No lifting over 5 lbs for 1 week. No sexual activity for 1 week.  ° °You May Return to Work: in 1 week (if applicable) ° °Wound Care: You may wash cath site gently with soap and water. Keep cath site clean and dry. If you notice pain, swelling, bleeding or pus at your cath site, please call [phone]. ° ° ° °Peripheral Vascular Cath Site Care °Refer to this sheet in the next few weeks. These instructions provide you with information on caring for yourself after your procedure. Your caregiver may also give you more specific instructions. Your treatment has been planned according to current medical practices, but problems sometimes occur. Call your caregiver if you have any problems or questions after your procedure. °HOME CARE INSTRUCTIONS °You may shower 24 hours after the procedure. Remove the bandage (dressing) and gently wash the site with plain soap and water. Gently pat the site dry.  °Do not apply powder or lotion to the site.  °Do not sit in a bathtub, swimming pool, or whirlpool for 5 to 7 days.  °No bending, squatting, or lifting anything over 10 pounds (4.5 kg) as directed by your caregiver.  °Inspect the site at least twice daily.  °Do not drive home if you are discharged the same day of the procedure. Have someone else drive you.  °You may drive 24 hours after the procedure unless otherwise instructed by your caregiver.  °What to expect: °Any bruising will usually fade within 1 to 2 weeks.  °Blood that collects in the tissue (hematoma) may be painful to the touch. It should usually decrease in size and tenderness within 1 to 2 weeks.  °SEEK IMMEDIATE MEDICAL CARE IF: °You have unusual pain at the site or down the affected  limb.  °You have redness, warmth, swelling, or pain at the site.  °You have drainage (other than a small amount of blood on the dressing).  °You have chills.  °You have a fever or persistent symptoms for more than 72 hours.  °You have a fever and your symptoms suddenly get worse.  °Your leg becomes pale, cool, tingly, or numb.  °You have heavy bleeding from the site. Hold pressure on the site.  °Document Released: 02/19/2010 Document Revised: 01/06/2011 Document Reviewed: 02/19/2010 °[employer]® Patient Information ©2012 [employer]. ° °

## 2018-11-06 NOTE — Progress Notes (Signed)
Progress Note  Patient Name: Makoto Sellitto Date of Encounter: 11/06/2018  Primary Cardiologist:  [name], MD  Subjective   Has not been oob much but has been doing ok, no chest pain, no SOB, no leg pain.   Inpatient Medications    Scheduled Meds: . ALPRAZolam  1 mg Oral QHS  . aspirin EC  81 mg Oral Daily  . clopidogrel  75 mg Oral Q breakfast  . [START ON 11/07/2018] influenza vac split quadrivalent PF  0.5 mL Intramuscular Tomorrow-1000  . irbesartan  150 mg Oral Daily  . metoprolol succinate  75 mg Oral Daily  . rosuvastatin  40 mg Oral Daily  . sodium chloride flush  3 mL Intravenous Q12H  . sodium chloride flush  3 mL Intravenous Q12H  . tamsulosin  0.4 mg Oral QPM  . traMADol  50 mg Oral 5 X Daily   Continuous Infusions: . sodium chloride     PRN Meds: sodium chloride, acetaminophen, hydrALAZINE, labetalol, morphine injection, ondansetron (ZOFRAN) IV, sodium chloride flush   Vital Signs    Vitals:   11/06/18 0053 11/06/18 0249 11/06/18 0641 11/06/18 0819  BP: 96/70 103/62 121/77 133/73  Pulse: 65  62 71  Resp:      Temp:   (!) 97.5 F (36.4 C)   TempSrc:   Oral   SpO2: 97%  100%   Weight:   83.3 kg   Height:        Intake/Output Summary (Last 24 hours) at 11/06/2018 0834 Last data filed at 11/05/2018 2012 Gross per 24 hour  Intake -  Output 425 ml  Net -425 ml   Filed Weights   11/05/18 0909 11/06/18 0641  Weight: 83.9 kg 83.3 kg   Last Weight  Most recent update: 11/06/2018  6:42 AM   Weight  83.3 kg (183 lb 11.2 oz)           Weight change:    Telemetry    SR - Personally Reviewed  ECG    None today - Personally Reviewed  Physical Exam   General: Well developed, well nourished, male appearing in no acute distress. Head: Normocephalic, atraumatic.  Neck: Supple without bruits, JVD not elevated. Lungs:  Resp regular and unlabored, CTA. Heart: RRR, S1, S2, no S3, S4, or murmur; no rub. Abdomen: Soft, non-tender,  non-distended with normoactive bowel sounds. No hepatomegaly. No rebound/guarding. No obvious abdominal masses. Extremities: No clubbing, cyanosis, no edema. R pedal pulses are 2+, L pedal pulses Dopplered. Neuro: Alert and oriented X 3. Moves all extremities spontaneously. Psych: Normal affect.  Labs    Hematology Recent Labs  Lab 11/06/18 0327  WBC 8.3  RBC 4.49  HGB 14.0  HCT 41.1  MCV 91.5  MCH 31.2  MCHC 34.1  RDW 11.9  PLT 192    Chemistry Recent Labs  Lab 11/06/18 0327  NA 138  K 4.0  CL 105  CO2 24  GLUCOSE 95  BUN 9  CREATININE 0.82  CALCIUM 8.8*  GFRNONAA >60  GFRAA >60  ANIONGAP 9     Cardiac Studies   Procedures Performed:               1.  Ultrasound-guided right and left common femoral access               2.  Abdominal aortogram, bilateral iliac angiogram/bifemoral runoff               3.  Diamondback orbital rotational atherectomy  left common iliac artery               4.  VBX covered stenting left common iliac artery Angiographic Data:   1: Abdominal aorta- this abdominal aorta was free of atherosclerotic changes.  The renal arteries appeared normal although the left renal artery arose apparently. 2: Left lower extremity- multiple calcific/exophytic intraluminal filling defects in the entire left common iliac artery.  95% tandem calcified mid and distal left SFA stenosis, calcified occluded left popliteal artery with one-vessel runoff via the anterior tibial 3: Right lower extremity- 60% calcified ostial right common iliac artery stenosis, 95% calcified segmental mid right SFA stenosis with three-vessel runoff  IMPRESSION: Mr. Cauthorn has calcified exophytic plaques in his entire left iliac system as well as tandem high-grade calcific plaques in his mid and distal left SFA with an occluded left popliteal artery and one-vessel runoff.  He has lifestyle and claudication.  We will proceed with diamondback orbital rotational atherectomy of the entire  left iliac artery followed by VBX covered stenting.  He will need staged left SFA plus or minus popliteal intervention.  Procedure Description: The patient received a total of 8 thousandths of heparin with an ACT of 230.  A total of 190 cc of contrast was administered the patient.  The 5 French sheath was exchanged for a 7 French bright tip sheath on the left side.  I placed an 014 Viper wire across the diseased segment into the mid abdominal aorta.  I then use it used a 2.0 mm solid bur and performed rotational atherectomy of the entire left iliac system of 220,000 RPMs for 3 runs getting copious amounts of intra-arterial nitroglycerin.  I then exchanged the Viper wire through an endhole catheter for an 035 versa core wire and carefully positioned a six 8 mm x 59 mm long VBX covered stent across the treated/diseased segment.  I deployed at 10 atm resulting reduction of high-grade calcified segmental left common iliac artery stenosis to 0% residual.  Patient tolerated procedure well.  The bright tip sheath was then exchanged over the wire for a short 7 [place] sheath.  Both sheaths were secured in place.  Patient left lab in stable condition.  Final Impression: Successful diamondback orbital rotational atherectomy, VBX covered stenting of a highly calcified segmental left common iliac artery stenosis.  Patient has residual tandem high-grade calcified mid and distal left SFA stenoses as well as an occluded left popliteal artery which was calcified as well with one-vessel runoff.  He will need staged intervention of his left SFA proximal popliteal artery.  The sheath will be removed once ACT falls below 170 and pressure held.  Patient be gently hydrated overnight and discharged home in the morning.  We will obtain lower extremity arterial Doppler studies in our [facility] line office next week and I will see him back the week after for follow-up.   Patient Profile     63 y.o. male w/ hx CABG 2005 w/ LIMA-LAD,  SVG-PDA-OM1-OM2, MV 2012 ok, HLD, carotid dz, PAD. Admitted 10/05 for LE angio for claudication sx and abnl ABIs.   Assessment & Plan    1. PAD: - s/p diamondback rotational atherectomy s/ VBX stent L CIA - still w/ L- SFA dz and occluded L popliteal - f/u is scheduled in the office - ok for d/c if does ok w/ ambulation  Active Problems:   Claudication in peripheral vascular disease ([place])    Signed, Rosaria Ferries , PA-C 8:34 AM 11/06/2018  Pager: [phone]

## 2018-11-20 ENCOUNTER — Ambulatory Visit (HOSPITAL_BASED_OUTPATIENT_CLINIC_OR_DEPARTMENT_OTHER)
Admission: RE | Admit: 2018-11-20 | Discharge: 2018-11-20 | Disposition: A | Discharge status: Home / Self Care | Payer: BC Managed Care – PPO | Source: Ambulatory Visit | Attending: Cardiovascular Disease | Admitting: Cardiovascular Disease

## 2018-11-20 ENCOUNTER — Ambulatory Visit (HOSPITAL_COMMUNITY)
Admission: RE | Admit: 2018-11-20 | Discharge: 2018-11-20 | Disposition: A | Discharge status: Home / Self Care | Payer: BC Managed Care – PPO | Source: Ambulatory Visit | Attending: Cardiology | Admitting: Cardiology

## 2018-11-20 ENCOUNTER — Other Ambulatory Visit: Payer: Self-pay

## 2018-11-20 DIAGNOSIS — I739 Peripheral vascular disease, unspecified: Secondary | ICD-10-CM

## 2018-11-21 ENCOUNTER — Telehealth: Payer: Self-pay | Admitting: Cardiovascular Disease

## 2018-11-21 NOTE — Telephone Encounter (Signed)
Spoke with pt, aware of test results. Order placed

## 2018-11-21 NOTE — Telephone Encounter (Signed)
New message   Patient is returning call for Aorta test results. Please call.

## 2018-11-27 ENCOUNTER — Other Ambulatory Visit: Payer: Self-pay

## 2018-11-27 ENCOUNTER — Encounter: Payer: Self-pay | Admitting: Cardiovascular Disease

## 2018-11-27 ENCOUNTER — Ambulatory Visit: Payer: BC Managed Care – PPO | Admitting: Cardiovascular Disease

## 2018-11-27 DIAGNOSIS — I739 Peripheral vascular disease, unspecified: Secondary | ICD-10-CM

## 2018-11-27 DIAGNOSIS — G4733 Obstructive sleep apnea (adult) (pediatric): Secondary | ICD-10-CM | POA: Diagnosis not present

## 2018-11-27 NOTE — Assessment & Plan Note (Signed)
Jorge Jackson has extensive callus calcified PAD left worse than right with left calf greater than right calf claudication.  I performed angiography on him 11/05/2018 revealing high-grade calcified left common iliac artery stenosis, mid left SFA stenosis and an occluded calcified left popliteal artery with one-vessel runoff via an anterior tibial.  He did have a 60% ostial right common iliac artery stenosis and 95% calcified mid right SFA with three-vessel runoff.  He underwent diamondback orbital rotational atherectomy, PTA and covered stenting using a 8 mm x 59 mm long VBX covered stent.  His ABI really did not change much but his iliac Dopplers left improved.  His claudication has minimally improved.  He will need left SFA orbital atherectomy, PTA in order to improve collateral flow around his popliteal lesion to his anterior tibial.

## 2018-11-27 NOTE — Patient Instructions (Signed)
Jorge Jackson [name] Dept: [phone] Loc: [phone]  Jorge Jackson  11/27/2018  You are scheduled for a Peripheral Angiogram on Monday, November 9 with Dr. [name].  1. Please arrive at the [facility] (Main Entrance A) at [facility]: [address] [place], [place] [address] at 5:30 AM (This time is two hours before your procedure to ensure your preparation). Free valet parking service is available.   Special note: Every effort is made to have your procedure done on time. Please understand that emergencies sometimes delay scheduled procedures.  2. Diet: Do not eat solid foods after midnight.  The patient may have clear liquids until 5am upon the day of the procedure.  3. Labs:  You will need to have blood drawn on 12/06/2018 Go to:  [facility] at [id number], [place], [place] [address] FOR YOUR BASIC METABOLIC PANEL, COMPLETE BLOOD COUNT, AND THYROID STIMULATING HORMONE LAB WORK. NO APPOINTMENT IS NEEDED. YOU MUST HAVE THIS LAB WORK DONE BEFORE GOING TO GET YOUR COVID-19 TEST DONE BECAUSE YOU WILL NEED TO SELF-ISOLATE AFTER THE COVID-19 TEST UNTIL THE DAY OF YOUR [place].  You will need a COVID-19 test on 12/06/2018 at 3:10 pm Go to: [facility] Entrance [place], [place] [address] FOR YOUR COVID-19 TEST. YOU MUST HAVE YOUR COVID-19 TEST COMPLETED 4 DAYS PRIOR TO YOUR UPCOMING PROCEDURE/TEST. YOU WILL ALSO NEED TO SELF-ISOLATE AFTER THE COVID-19 TEST UNTIL THE DAY OF YOUR PROCEDURE/TEST. PLEASE BRING YOUR I.D. AND YOUR INSURANCE CARD(S) WITH YOU.    4. Medication instructions in preparation for your procedure:  On the morning of your procedure, take your Plavix/Clopidogrel and any morning medicines NOT listed above.  You may use sips of water.  5. Plan for one night  stay--bring personal belongings. 6. Bring a current list of your medications and current insurance cards. 7. You MUST have a responsible person to drive you home. 8. Someone MUST be with you the first 24 hours after you arrive home or your discharge will be delayed. 9. Please wear clothes that are easy to get on and off and wear slip-on shoes.  Thank you for allowing [place] to care for you!   -- [facility]  __________________________________________________________________ Testing/Procedures: Your physician has requested that you have an aorta/iliac duplex. During this test, an ultrasound is used to evaluate blood flow to the aorta and iliac arteries. Allow one hour for this exam. Do not eat after midnight the day before and avoid carbonated beverages. DUE AFTER YOUR PROCEDURE. TO BE SCHEDULED  Your physician has requested that you have a lower or upper extremity arterial duplex. This test is an ultrasound of the arteries in the legs or arms. It looks at arterial blood flow in the legs and arms. Allow one hour for Lower and Upper Arterial scans. There are no restrictions or special instructions DUE AFTER YOUR PROCEDURE. TO BE SCHEDULED  Follow-Up: At [facility], you and your health needs are our priority.  As part of our continuing mission to provide you with exceptional heart care, we have created designated Provider Care Teams.  These Care Teams include your primary Cardiologist (physician) and Advanced Practice Providers (APPs -  Physician Assistants and Nurse Practitioners) who all work together to provide you with the care you need, when you need it. You will need a follow up appointment  with Dr. [name] after your ultrasounds. To be scheduled

## 2018-11-27 NOTE — Progress Notes (Signed)
11/27/2018 Jorge Jackson   1955-02-11  [id number]  Primary Physician [name]., MD Primary Cardiologist: Lorretta Harp MD [name], [place], [place]  HPI:  Jorge Jackson is a 63 y.o.  mildly overweight married Caucasian male father of 37, grandfather to 1 grandson who I last saw 8/19/9/24/2020 2020.[name] He lost his job at the end of 2011. He worked at the [employer] in [place] which was outsourced to [place]. He was then hired by a [employer] in March of2017in [place] and now works at [employer].. He had coronary artery bypass grafting in February of 2005 by Dr. Tharon Aquas Trigt with a LIMA to his LAD, a vein to a PDA, OM1 and OM2 sequentially. He is totally asymptomatic. He has mild left internal carotid artery stenosis which we follow by duplex ultrasound. Dr. Micheal Likens follows his lipid profile. His last Myoview performed January 20, 2011 was nonischemicHe does complain of lethargy and fatigue and relates symptoms compatible with obstructive sleep apnea.He does wear his CPAP.   Since I saw him 2 years ago he is done well from a cardiac point of view. He denies chest pain or shortness of breath. He does relate new onset left calf claudication over the last 8 to 12 months occurring at less than 100 yards.  I performed lower extremity arterial Doppler studies on him 10/11/2018 revealing a right ABI of 1.13 and a left of 0.7.  He did have a high-frequency signal in his mid left SFA and what appeared to be an occluded left tibioperoneal trunk.  I performed peripheral angiography on him 11/05/2018 revealing high-grade calcified left common iliac artery stenosis, bilateral calcified mid at SFA stenoses with an occluded left popliteal artery.  One-vessel runoff on the left and 3 on the right.  I performed diamondback orbital rotational atherectomy, PTA and covered stenting of the entire left common iliac artery with a 8 mm x 59 mm long VBX covered stent.  She had an excellent angiographic  result.  His claudication is minimally improved.  His ABI really has not changed.  The velocities in his left iliac are normal.  He wishes to proceed with staged mid left SFA orbital atherectomy and drug-coated balloon angioplasty letter to him improve collaterals to his tibial vessels beyond the occluded popliteal artery.  Current Meds  Medication Sig  . ALPRAZolam (XANAX) 1 MG tablet Take 1 tablet by mouth at bedtime.  [name] aspirin EC 81 MG tablet Take 81 mg by mouth daily.  [name] CIALIS 20 MG tablet Take 20 mg by mouth daily as needed for erectile dysfunction.   . clopidogrel (PLAVIX) 75 MG tablet Take 75 mg by mouth daily with breakfast.  . metoprolol succinate (TOPROL-XL) 50 MG 24 hr tablet Take 75 mg by mouth daily. Take with or immediately following a meal.  . olmesartan (BENICAR) 20 MG tablet Take 20 mg by mouth daily.  . Omega-3 Fatty Acids (OMEGA 3 PO) Take 950 mg by mouth 2 (two) times daily.  . rosuvastatin (CRESTOR) 40 MG tablet Take 40 mg by mouth daily.  . Saw Palmetto, Serenoa repens, (SAW PALMETTO PO) Take 1 capsule by mouth every evening.  . tamsulosin (FLOMAX) 0.4 MG CAPS capsule Take 0.4 mg by mouth every evening.   [name] tiZANidine (ZANAFLEX) 4 MG tablet Take 4 mg by mouth as directed.  . traMADol (ULTRAM) 50 MG tablet Take 50 mg by mouth 5 (five) times daily.   [name] Ubiquinone (ULTRA COQ10 PO) Take 1  capsule by mouth daily.     No Active Allergies  Social History   Socioeconomic History  . Marital status: Married    Spouse name: Not on file  . Number of children: Not on file  . Years of education: Not on file  . Highest education level: Not on file  Occupational History  . Not on file  Social Needs  . Financial resource strain: Not on file  . Food insecurity    Worry: Not on file    Inability: Not on file  . Transportation needs    Medical: Not on file    Non-medical: Not on file  Tobacco Use  . Smoking status: Former Smoker    Types: Cigarettes    Quit date:  02/01/2003    Years since quitting: 15.8  . Smokeless tobacco: Former Systems developer    Types: Chew  Substance and Sexual Activity  . Alcohol use: Yes    Alcohol/week: 1.0 standard drinks    Types: 1 Cans of beer per week  . Drug use: Not on file  . Sexual activity: Not on file  Lifestyle  . Physical activity    Days per week: Not on file    Minutes per session: Not on file  . Stress: Not on file  Relationships  . Social connections    Talks on phone: More than three times a week    Gets together: Not on file    Attends religious service: Not on file    Active member of club or organization: Not on file    Attends meetings of clubs or organizations: Not on file    Relationship status: Not on file  . Intimate partner violence    Fear of current or ex partner: No    Emotionally abused: No    Physically abused: No    Forced sexual activity: No  Other Topics Concern  . Not on file  Social History Narrative  . Not on file     Review of Systems: General: negative for chills, fever, night sweats or weight changes.  Cardiovascular: negative for chest pain, dyspnea on exertion, edema, orthopnea, palpitations, paroxysmal nocturnal dyspnea or shortness of breath Dermatological: negative for rash Respiratory: negative for cough or wheezing Urologic: negative for hematuria Abdominal: negative for nausea, vomiting, diarrhea, bright red blood per rectum, melena, or hematemesis Neurologic: negative for visual changes, syncope, or dizziness All other systems reviewed and are otherwise negative except as noted above.    Blood pressure 124/79, pulse 75, height 5\' 7"  (1.702 m), weight 190 lb (86.2 kg), SpO2 96 %.  General appearance: alert and no distress Neck: no adenopathy, no carotid bruit, no JVD, supple, symmetrical, trachea midline and thyroid not enlarged, symmetric, no tenderness/mass/nodules Lungs: clear to auscultation bilaterally Heart: regular rate and rhythm, S1, S2 normal, no murmur,  click, rub or gallop Extremities: extremities normal, atraumatic, no cyanosis or edema Pulses: 2+ and symmetric Skin: Skin color, texture, turgor normal. No rashes or lesions Neurologic: Alert and oriented X 3, normal strength and tone. Normal symmetric reflexes. Normal coordination and gait  EKG not performed today  ASSESSMENT AND PLAN:   Claudication in peripheral vascular disease [facility]) Mr. Holste has extensive callus calcified PAD left worse than right with left calf greater than right calf claudication.  I performed angiography on him 11/05/2018 revealing high-grade calcified left common iliac artery stenosis, mid left SFA stenosis and an occluded calcified left popliteal artery with one-vessel runoff via an anterior tibial.  He  did have a 60% ostial right common iliac artery stenosis and 95% calcified mid right SFA with three-vessel runoff.  He underwent diamondback orbital rotational atherectomy, PTA and covered stenting using a 8 mm x 59 mm long VBX covered stent.  His ABI really did not change much but his iliac Dopplers left improved.  His claudication has minimally improved.  He will need left SFA orbital atherectomy, PTA in order to improve collateral flow around his popliteal lesion to his anterior tibial.      Lorretta Harp MD [facility], [facility] 11/27/2018 4:58 PM

## 2018-11-27 NOTE — H&P (View-Only) (Signed)
11/27/2018 Jorge Jackson   1955-07-26  [id number]  Primary Physician [name]., MD Primary Cardiologist: Lorretta Harp MD [name], [place], [place]  HPI:  Jorge Jackson is a 63 y.o.  mildly overweight married Caucasian male father of 54, grandfather to 1 grandson who I last saw 8/19/9/24/2020 2020.[name] He lost his job at the end of 2011. He worked at the [employer] in [place] which was outsourced to [place]. He was then hired by a [employer] in March of2017in [place] and now works at [employer].. He had coronary artery bypass grafting in February of 2005 by Dr. Tharon Aquas Trigt with a LIMA to his LAD, a vein to a PDA, OM1 and OM2 sequentially. He is totally asymptomatic. He has mild left internal carotid artery stenosis which we follow by duplex ultrasound. Dr. Micheal Likens follows his lipid profile. His last Myoview performed January 20, 2011 was nonischemicHe does complain of lethargy and fatigue and relates symptoms compatible with obstructive sleep apnea.He does wear his CPAP.   Since I saw him 2 years ago he is done well from a cardiac point of view. He denies chest pain or shortness of breath. He does relate new onset left calf claudication over the last 8 to 12 months occurring at less than 100 yards.  I performed lower extremity arterial Doppler studies on him 10/11/2018 revealing a right ABI of 1.13 and a left of 0.7.  He did have a high-frequency signal in his mid left SFA and what appeared to be an occluded left tibioperoneal trunk.  I performed peripheral angiography on him 11/05/2018 revealing high-grade calcified left common iliac artery stenosis, bilateral calcified mid at SFA stenoses with an occluded left popliteal artery.  One-vessel runoff on the left and 3 on the right.  I performed diamondback orbital rotational atherectomy, PTA and covered stenting of the entire left common iliac artery with a 8 mm x 59 mm long VBX covered stent.  She had an excellent angiographic  result.  His claudication is minimally improved.  His ABI really has not changed.  The velocities in his left iliac are normal.  He wishes to proceed with staged mid left SFA orbital atherectomy and drug-coated balloon angioplasty letter to him improve collaterals to his tibial vessels beyond the occluded popliteal artery.  Current Meds  Medication Sig  . ALPRAZolam (XANAX) 1 MG tablet Take 1 tablet by mouth at bedtime.  [name] aspirin EC 81 MG tablet Take 81 mg by mouth daily.  [name] CIALIS 20 MG tablet Take 20 mg by mouth daily as needed for erectile dysfunction.   . clopidogrel (PLAVIX) 75 MG tablet Take 75 mg by mouth daily with breakfast.  . metoprolol succinate (TOPROL-XL) 50 MG 24 hr tablet Take 75 mg by mouth daily. Take with or immediately following a meal.  . olmesartan (BENICAR) 20 MG tablet Take 20 mg by mouth daily.  . Omega-3 Fatty Acids (OMEGA 3 PO) Take 950 mg by mouth 2 (two) times daily.  . rosuvastatin (CRESTOR) 40 MG tablet Take 40 mg by mouth daily.  . Saw Palmetto, Serenoa repens, (SAW PALMETTO PO) Take 1 capsule by mouth every evening.  . tamsulosin (FLOMAX) 0.4 MG CAPS capsule Take 0.4 mg by mouth every evening.   [name] tiZANidine (ZANAFLEX) 4 MG tablet Take 4 mg by mouth as directed.  . traMADol (ULTRAM) 50 MG tablet Take 50 mg by mouth 5 (five) times daily.   [name] Ubiquinone (ULTRA COQ10 PO) Take 1  capsule by mouth daily.     No Active Allergies  Social History   Socioeconomic History  . Marital status: Married    Spouse name: Not on file  . Number of children: Not on file  . Years of education: Not on file  . Highest education level: Not on file  Occupational History  . Not on file  Social Needs  . Financial resource strain: Not on file  . Food insecurity    Worry: Not on file    Inability: Not on file  . Transportation needs    Medical: Not on file    Non-medical: Not on file  Tobacco Use  . Smoking status: Former Smoker    Types: Cigarettes    Quit date:  02/01/2003    Years since quitting: 15.8  . Smokeless tobacco: Former Systems developer    Types: Chew  Substance and Sexual Activity  . Alcohol use: Yes    Alcohol/week: 1.0 standard drinks    Types: 1 Cans of beer per week  . Drug use: Not on file  . Sexual activity: Not on file  Lifestyle  . Physical activity    Days per week: Not on file    Minutes per session: Not on file  . Stress: Not on file  Relationships  . Social connections    Talks on phone: More than three times a week    Gets together: Not on file    Attends religious service: Not on file    Active member of club or organization: Not on file    Attends meetings of clubs or organizations: Not on file    Relationship status: Not on file  . Intimate partner violence    Fear of current or ex partner: No    Emotionally abused: No    Physically abused: No    Forced sexual activity: No  Other Topics Concern  . Not on file  Social History Narrative  . Not on file     Review of Systems: General: negative for chills, fever, night sweats or weight changes.  Cardiovascular: negative for chest pain, dyspnea on exertion, edema, orthopnea, palpitations, paroxysmal nocturnal dyspnea or shortness of breath Dermatological: negative for rash Respiratory: negative for cough or wheezing Urologic: negative for hematuria Abdominal: negative for nausea, vomiting, diarrhea, bright red blood per rectum, melena, or hematemesis Neurologic: negative for visual changes, syncope, or dizziness All other systems reviewed and are otherwise negative except as noted above.    Blood pressure 124/79, pulse 75, height 5\' 7"  (1.702 m), weight 190 lb (86.2 kg), SpO2 96 %.  General appearance: alert and no distress Neck: no adenopathy, no carotid bruit, no JVD, supple, symmetrical, trachea midline and thyroid not enlarged, symmetric, no tenderness/mass/nodules Lungs: clear to auscultation bilaterally Heart: regular rate and rhythm, S1, S2 normal, no murmur,  click, rub or gallop Extremities: extremities normal, atraumatic, no cyanosis or edema Pulses: 2+ and symmetric Skin: Skin color, texture, turgor normal. No rashes or lesions Neurologic: Alert and oriented X 3, normal strength and tone. Normal symmetric reflexes. Normal coordination and gait  EKG not performed today  ASSESSMENT AND PLAN:   Claudication in peripheral vascular disease [facility]) Mr. Fryson has extensive callus calcified PAD left worse than right with left calf greater than right calf claudication.  I performed angiography on him 11/05/2018 revealing high-grade calcified left common iliac artery stenosis, mid left SFA stenosis and an occluded calcified left popliteal artery with one-vessel runoff via an anterior tibial.  He  did have a 60% ostial right common iliac artery stenosis and 95% calcified mid right SFA with three-vessel runoff.  He underwent diamondback orbital rotational atherectomy, PTA and covered stenting using a 8 mm x 59 mm long VBX covered stent.  His ABI really did not change much but his iliac Dopplers left improved.  His claudication has minimally improved.  He will need left SFA orbital atherectomy, PTA in order to improve collateral flow around his popliteal lesion to his anterior tibial.      Lorretta Harp MD [facility], [facility] 11/27/2018 4:58 PM

## 2018-11-27 NOTE — Addendum Note (Signed)
Addended by: Annita Brod on: 11/27/2018 05:49 PM   Modules accepted: Orders

## 2018-11-28 ENCOUNTER — Other Ambulatory Visit: Payer: Self-pay

## 2018-12-06 ENCOUNTER — Telehealth: Payer: Self-pay

## 2018-12-06 ENCOUNTER — Telehealth: Payer: Self-pay | Admitting: *Deleted

## 2018-12-06 ENCOUNTER — Other Ambulatory Visit (HOSPITAL_COMMUNITY)
Admission: RE | Admit: 2018-12-06 | Discharge: 2018-12-06 | Disposition: A | Discharge status: Home / Self Care | Payer: BC Managed Care – PPO | Source: Ambulatory Visit | Attending: Cardiovascular Disease | Admitting: Cardiovascular Disease

## 2018-12-06 DIAGNOSIS — Z01812 Encounter for preprocedural laboratory examination: Secondary | ICD-10-CM | POA: Diagnosis not present

## 2018-12-06 DIAGNOSIS — Z20828 Contact with and (suspected) exposure to other viral communicable diseases: Secondary | ICD-10-CM | POA: Insufficient documentation

## 2018-12-06 DIAGNOSIS — I739 Peripheral vascular disease, unspecified: Secondary | ICD-10-CM | POA: Diagnosis not present

## 2018-12-06 NOTE — Telephone Encounter (Addendum)
Pt contacted pre abdominal aortogram  scheduled at [facility] for: Monday December 10, 2018 7:30 AM Verified arrival time and place: [place]) at: 5:30 AM   No solid food after midnight prior to cath, clear liquids until 5 AM day of procedure. Contrast allergy: no  Hold: Cialis-48 hours pre procedure.  AM meds can be  taken pre-cath with sip of water including: ASA 81 mg Plavix 75 mg   Confirmed patient has responsible adult to drive home post procedure and observe 24 hours after arriving home: yes  Currently, due to Covid-19 pandemic, only one support person will be allowed with patient. Must be the same support person for that patient's entire stay, will be screened and required to wear a mask. They will be asked to wait in the waiting room for the duration of the patient's stay.  Patients are required to wear a mask when they enter the hospital.     COVID-19 Pre-Screening Questions:  . In the past 7 to 10 days have you had a cough,  shortness of breath, headache, congestion, fever (100 or greater) body aches, chills, sore throat, or sudden loss of taste or sense of smell? no . Have you been around anyone with known Covid 19? no . Have you been around anyone who is awaiting Covid 19 test results in the past 7 to 10 days? no . Have you been around anyone who has been exposed to Covid 19, or has mentioned symptoms of Covid 19 within the past 7 to 10 days? No  I reviewed procedure/mask/visitor instructions, Covid-19 screening questions with patient, he verbalized understanding, thanked me for call.

## 2018-12-06 NOTE — Telephone Encounter (Signed)
12/06/2018 Patient walked in to bring 25.00 check for his Disability FMLA  Form from [employer].  I inter-office it to [employer] to process.  cbr

## 2018-12-07 ENCOUNTER — Encounter: Payer: Self-pay | Admitting: *Deleted

## 2018-12-07 LAB — BASIC METABOLIC PANEL
BUN/Creatinine Ratio: 13 (ref 10–24)
BUN: 11 mg/dL (ref 8–27)
CO2: 23 mmol/L (ref 20–29)
Calcium: 9.4 mg/dL (ref 8.6–10.2)
Chloride: 101 mmol/L (ref 96–106)
Creatinine, Ser: 0.87 mg/dL (ref 0.76–1.27)
GFR calc Af Amer: 108 mL/min/{1.73_m2} (ref >59)
GFR calc non Af Amer: 93 mL/min/{1.73_m2} (ref >59)
Glucose: 77 mg/dL (ref 65–99)
Potassium: 4.2 mmol/L (ref 3.5–5.2)
Sodium: 139 mmol/L (ref 134–144)

## 2018-12-07 LAB — CBC
Hematocrit: 39.5 % (ref 37.5–51.0)
Hemoglobin: 13.7 g/dL (ref 13.0–17.7)
MCH: 31.3 pg (ref 26.6–33.0)
MCHC: 34.7 g/dL (ref 31.5–35.7)
MCV: 90 fL (ref 79–97)
Platelets: 219 10*3/uL (ref 150–450)
RBC: 4.38 x10E6/uL (ref 4.14–5.80)
RDW: 12.2 % (ref 11.6–15.4)
WBC: 7 10*3/uL (ref 3.4–10.8)

## 2018-12-07 LAB — NOVEL CORONAVIRUS, NAA (HOSP ORDER, SEND-OUT TO REF LAB; TAT 18-24 HRS): SARS-CoV-2, NAA: NOT DETECTED

## 2018-12-10 ENCOUNTER — Ambulatory Visit (HOSPITAL_COMMUNITY)
Admission: RE | Admit: 2018-12-10 | Discharge: 2018-12-11 | Disposition: A | Discharge status: Home / Self Care | Payer: BC Managed Care – PPO | Attending: Cardiovascular Disease | Admitting: Cardiovascular Disease

## 2018-12-10 ENCOUNTER — Encounter (HOSPITAL_COMMUNITY): Payer: Self-pay | Admitting: Cardiovascular Disease

## 2018-12-10 ENCOUNTER — Other Ambulatory Visit: Payer: Self-pay

## 2018-12-10 ENCOUNTER — Ambulatory Visit (HOSPITAL_COMMUNITY)
Admission: RE | Disposition: A | Discharge status: Home / Self Care | Payer: Self-pay | Source: Home / Self Care | Attending: Cardiovascular Disease

## 2018-12-10 DIAGNOSIS — Z79899 Other long term (current) drug therapy: Secondary | ICD-10-CM | POA: Insufficient documentation

## 2018-12-10 DIAGNOSIS — I739 Peripheral vascular disease, unspecified: Secondary | ICD-10-CM

## 2018-12-10 DIAGNOSIS — Z87891 Personal history of nicotine dependence: Secondary | ICD-10-CM | POA: Insufficient documentation

## 2018-12-10 DIAGNOSIS — I70213 Atherosclerosis of native arteries of extremities with intermittent claudication, bilateral legs: Secondary | ICD-10-CM | POA: Diagnosis not present

## 2018-12-10 DIAGNOSIS — G4733 Obstructive sleep apnea (adult) (pediatric): Secondary | ICD-10-CM | POA: Insufficient documentation

## 2018-12-10 DIAGNOSIS — Z7982 Long term (current) use of aspirin: Secondary | ICD-10-CM | POA: Diagnosis not present

## 2018-12-10 DIAGNOSIS — Z7902 Long term (current) use of antithrombotics/antiplatelets: Secondary | ICD-10-CM | POA: Insufficient documentation

## 2018-12-10 HISTORY — PX: PERIPHERAL VASCULAR INTERVENTION: CATH118257

## 2018-12-10 HISTORY — PX: PERIPHERAL VASCULAR ATHERECTOMY: CATH118256

## 2018-12-10 HISTORY — PX: LOWER EXTREMITY ANGIOGRAPHY: CATH118251

## 2018-12-10 LAB — POCT ACTIVATED CLOTTING TIME
Activated Clotting Time: 318 seconds
Activated Clotting Time: 252 seconds
Activated Clotting Time: 334 seconds

## 2018-12-10 SURGERY — LOWER EXTREMITY ANGIOGRAPHY
Anesthesia: LOCAL

## 2018-12-10 MED ORDER — SODIUM CHLORIDE 0.9 % IV SOLN: INTRAVENOUS | Status: AC

## 2018-12-10 MED ORDER — FENTANYL CITRATE (PF) 100 MCG/2ML IJ SOLN
INTRAMUSCULAR | Status: DC | PRN
  Administered 2018-12-10 (×3): 25 ug via INTRAVENOUS

## 2018-12-10 MED ORDER — NITROGLYCERIN 0.4 MG SL SUBL: 0.4000 mg | SUBLINGUAL_TABLET | SUBLINGUAL | Status: DC | PRN

## 2018-12-10 MED ORDER — HEPARIN SODIUM (PORCINE) 1000 UNIT/ML IJ SOLN
INTRAMUSCULAR | Status: AC
  Filled 2018-12-10: qty 1

## 2018-12-10 MED ORDER — HEPARIN (PORCINE) IN NACL 1000-0.9 UT/500ML-% IV SOLN
INTRAVENOUS | Status: DC | PRN
  Administered 2018-12-10 (×2): 500 mL

## 2018-12-10 MED ORDER — NITROGLYCERIN IN D5W 200-5 MCG/ML-% IV SOLN
INTRAVENOUS | Status: AC
  Filled 2018-12-10: qty 250

## 2018-12-10 MED ORDER — SODIUM CHLORIDE 0.9 % IV SOLN: 250.0000 mL | INTRAVENOUS | Status: DC | PRN

## 2018-12-10 MED ORDER — SODIUM CHLORIDE 0.9 % WEIGHT BASED INFUSION
3.0000 mL/kg/h | INTRAVENOUS | Status: DC
  Administered 2018-12-10: 3 mL/kg/h via INTRAVENOUS

## 2018-12-10 MED ORDER — LIDOCAINE HCL (PF) 1 % IJ SOLN
INTRAMUSCULAR | Status: DC | PRN
  Administered 2018-12-10: 28 mL

## 2018-12-10 MED ORDER — ASPIRIN EC 81 MG PO TBEC: 81.0000 mg | DELAYED_RELEASE_TABLET | Freq: Every day | ORAL | Status: DC

## 2018-12-10 MED ORDER — METOPROLOL SUCCINATE ER 50 MG PO TB24
75.0000 mg | ORAL_TABLET | Freq: Every day | ORAL | Status: DC
  Administered 2018-12-11: 75 mg via ORAL
  Filled 2018-12-10: qty 1

## 2018-12-10 MED ORDER — SODIUM CHLORIDE 0.9% FLUSH: 3.0000 mL | INTRAVENOUS | Status: DC | PRN

## 2018-12-10 MED ORDER — HEPARIN SODIUM (PORCINE) 1000 UNIT/ML IJ SOLN
INTRAMUSCULAR | Status: DC | PRN
  Administered 2018-12-10: 2500 [IU] via INTRAVENOUS
  Administered 2018-12-10: 9000 [IU] via INTRAVENOUS

## 2018-12-10 MED ORDER — CLOPIDOGREL BISULFATE 75 MG PO TABS: 75.0000 mg | ORAL_TABLET | Freq: Every day | ORAL | Status: DC

## 2018-12-10 MED ORDER — MIDAZOLAM HCL 2 MG/2ML IJ SOLN
INTRAMUSCULAR | Status: AC
  Filled 2018-12-10: qty 2

## 2018-12-10 MED ORDER — MORPHINE SULFATE (PF) 2 MG/ML IV SOLN: 2.0000 mg | INTRAVENOUS | Status: DC | PRN

## 2018-12-10 MED ORDER — ALPRAZOLAM 0.5 MG PO TABS
1.0000 mg | ORAL_TABLET | Freq: Every day | ORAL | Status: DC
  Administered 2018-12-10: 21:00:00 1 mg via ORAL
  Filled 2018-12-10: qty 2

## 2018-12-10 MED ORDER — VERAPAMIL HCL 2.5 MG/ML IV SOLN
INTRAVENOUS | Status: AC
  Filled 2018-12-10: qty 2

## 2018-12-10 MED ORDER — ROSUVASTATIN CALCIUM 20 MG PO TABS
40.0000 mg | ORAL_TABLET | Freq: Every evening | ORAL | Status: DC
  Administered 2018-12-10: 18:00:00 40 mg via ORAL
  Filled 2018-12-10: qty 2

## 2018-12-10 MED ORDER — LIDOCAINE HCL (PF) 1 % IJ SOLN
INTRAMUSCULAR | Status: AC
  Filled 2018-12-10: qty 30

## 2018-12-10 MED ORDER — ANGIOPLASTY BOOK
Freq: Once | Status: AC
  Administered 2018-12-11: 06:00:00
  Filled 2018-12-10: qty 1

## 2018-12-10 MED ORDER — SODIUM CHLORIDE 0.9% FLUSH
3.0000 mL | Freq: Two times a day (BID) | INTRAVENOUS | Status: DC
  Administered 2018-12-10: 3 mL via INTRAVENOUS

## 2018-12-10 MED ORDER — LABETALOL HCL 5 MG/ML IV SOLN: 10.0000 mg | INTRAVENOUS | Status: DC | PRN

## 2018-12-10 MED ORDER — VIPERSLIDE LUBRICANT OPTIME
TOPICAL | Status: DC | PRN
  Administered 2018-12-10: 10:00:00 via SURGICAL_CAVITY

## 2018-12-10 MED ORDER — FENTANYL CITRATE (PF) 100 MCG/2ML IJ SOLN
INTRAMUSCULAR | Status: AC
  Filled 2018-12-10: qty 2

## 2018-12-10 MED ORDER — CLOPIDOGREL BISULFATE 75 MG PO TABS
75.0000 mg | ORAL_TABLET | Freq: Every day | ORAL | Status: DC
  Administered 2018-12-11: 10:00:00 75 mg via ORAL
  Filled 2018-12-10: qty 1

## 2018-12-10 MED ORDER — CLOPIDOGREL BISULFATE 75 MG PO TABS: 75.0000 mg | ORAL_TABLET | ORAL | Status: DC

## 2018-12-10 MED ORDER — HYDRALAZINE HCL 20 MG/ML IJ SOLN: 5.0000 mg | INTRAMUSCULAR | Status: DC | PRN

## 2018-12-10 MED ORDER — SODIUM CHLORIDE 0.9% FLUSH: 3.0000 mL | Freq: Two times a day (BID) | INTRAVENOUS | Status: DC

## 2018-12-10 MED ORDER — NITROGLYCERIN 1 MG/10 ML FOR IR/CATH LAB
INTRA_ARTERIAL | Status: DC | PRN
  Administered 2018-12-10 (×5): 200 ug

## 2018-12-10 MED ORDER — HEPARIN (PORCINE) IN NACL 1000-0.9 UT/500ML-% IV SOLN
INTRAVENOUS | Status: AC
  Filled 2018-12-10: qty 1000

## 2018-12-10 MED ORDER — MIDAZOLAM HCL 2 MG/2ML IJ SOLN
INTRAMUSCULAR | Status: DC | PRN
  Administered 2018-12-10: 1 mg via INTRAVENOUS

## 2018-12-10 MED ORDER — ACETAMINOPHEN 325 MG PO TABS: 650.0000 mg | ORAL_TABLET | ORAL | Status: DC | PRN

## 2018-12-10 MED ORDER — TAMSULOSIN HCL 0.4 MG PO CAPS
0.8000 mg | ORAL_CAPSULE | Freq: Every evening | ORAL | Status: DC
  Administered 2018-12-10: 18:00:00 0.8 mg via ORAL
  Filled 2018-12-10: qty 2

## 2018-12-10 MED ORDER — ASPIRIN 81 MG PO CHEW: 81.0000 mg | CHEWABLE_TABLET | ORAL | Status: DC

## 2018-12-10 MED ORDER — LIDOCAINE-EPINEPHRINE 1 %-1:100000 IJ SOLN
INTRAMUSCULAR | Status: AC
  Filled 2018-12-10: qty 1

## 2018-12-10 MED ORDER — ASPIRIN EC 81 MG PO TBEC
81.0000 mg | DELAYED_RELEASE_TABLET | Freq: Every day | ORAL | Status: DC
  Administered 2018-12-11: 10:00:00 81 mg via ORAL
  Filled 2018-12-10: qty 1

## 2018-12-10 MED ORDER — ONDANSETRON HCL 4 MG/2ML IJ SOLN: 4.0000 mg | Freq: Four times a day (QID) | INTRAMUSCULAR | Status: DC | PRN

## 2018-12-10 MED ORDER — IODIXANOL 320 MG/ML IV SOLN
INTRAVENOUS | Status: DC | PRN
  Administered 2018-12-10: 105 mL via INTRA_ARTERIAL

## 2018-12-10 MED ORDER — SODIUM CHLORIDE 0.9 % WEIGHT BASED INFUSION
1.0000 mL/kg/h | INTRAVENOUS | Status: DC
  Administered 2018-12-10: 09:00:00 250 mL via INTRAVENOUS

## 2018-12-10 MED ORDER — IRBESARTAN 75 MG PO TABS
75.0000 mg | ORAL_TABLET | Freq: Every day | ORAL | Status: DC
  Administered 2018-12-11: 10:00:00 75 mg via ORAL
  Filled 2018-12-10: qty 1

## 2018-12-10 MED ORDER — TRAMADOL HCL 50 MG PO TABS: 50.0000 mg | ORAL_TABLET | Freq: Four times a day (QID) | ORAL | Status: DC | PRN

## 2018-12-10 SURGICAL SUPPLY — 34 items
BALLN IN.PACT DCB 5X150 (BALLOONS) ×3
BALLN IN.PACT DCB 6X40 (BALLOONS) ×3
BALLN STERLING OTW 6X40X135 (BALLOONS) ×3
BALLOON STERLING OTW 6X40X135 (BALLOONS) ×2 IMPLANT
BUR DBK EXCH SYS 1.25 MIC 145 (BURR) ×2 IMPLANT
BURR DBK EXCH SYS 1.25 MIC 145 (BURR) ×3
CATH CROSS OVER TEMPO 5F (CATHETERS) ×3 IMPLANT
CATH CXI SUPP 2.6F 150 ANG (CATHETERS) ×3 IMPLANT
CLOSURE MYNX CONTROL 6F/7F (Vascular Products) ×3 IMPLANT
CROWN 2.0 SOLID 145 DIAMONDBK (BURR) ×3 IMPLANT
DCB IN.PACT 5X150 (BALLOONS) ×2 IMPLANT
DCB IN.PACT 6X40 (BALLOONS) ×2 IMPLANT
DEVICE EMBOSHIELD NAV6 4.0-7.0 (FILTER) ×3 IMPLANT
DEVICE TORQUE .014-.018 (MISCELLANEOUS) ×2 IMPLANT
GUIDEWIRE REGALIA .014X300CM (WIRE) ×3 IMPLANT
KIT ENCORE 26 ADVANTAGE (KITS) ×3 IMPLANT
KIT PV (KITS) ×3 IMPLANT
LUBRICANT VIPERSLIDE CORONARY (MISCELLANEOUS) ×3 IMPLANT
SHEATH HIGHFLEX ANSEL 7FR 55CM (SHEATH) ×3 IMPLANT
SHEATH PINNACLE 5F 10CM (SHEATH) ×3 IMPLANT
SHEATH PINNACLE 7F 10CM (SHEATH) ×3 IMPLANT
SHEATH PROBE COVER 6X72 (BAG) ×3 IMPLANT
STENT ABSOLUTE PRO 6X60X135 (Permanent Stent) ×3 IMPLANT
STOPCOCK MORSE 400PSI 3WAY (MISCELLANEOUS) ×3 IMPLANT
SYR MEDRAD MARK 7 150ML (SYRINGE) ×3 IMPLANT
TAPE VIPERTRACK RADIOPAQ (MISCELLANEOUS) ×2 IMPLANT
TAPE VIPERTRACK RADIOPAQUE (MISCELLANEOUS) ×1
TORQUE DEVICE .014-.018 (MISCELLANEOUS) ×3
TRANSDUCER W/STOPCOCK (MISCELLANEOUS) ×3 IMPLANT
TRAY PV CATH (CUSTOM PROCEDURE TRAY) ×3 IMPLANT
TUBING CIL FLEX 10 FLL-RA (TUBING) ×3 IMPLANT
WIRE HITORQ VERSACORE ST 145CM (WIRE) ×3 IMPLANT
WIRE ROSEN-J .035X180CM (WIRE) ×3 IMPLANT
WIRE VIPER ADVANCE .017X335CM (WIRE) ×3 IMPLANT

## 2018-12-10 NOTE — Telephone Encounter (Signed)
12/10/2018 Received FMLA Form from [place] on patient for Dr. Alvester Chou to sign put it in Dr. Alvester Chou mail box to signed off on when he is back in office.  cbr

## 2018-12-10 NOTE — Interval H&P Note (Signed)
History and Physical Interval Note:  12/10/2018 7:42 AM  Jorge Jackson  has presented today for surgery, with the diagnosis of claudication.  The various methods of treatment have been discussed with the patient and family. After consideration of risks, benefits and other options for treatment, the patient has consented to  Procedure(s): ABDOMINAL AORTOGRAM W/LOWER EXTREMITY (N/A) PERIPHERAL VASCULAR ATHERECTOMY (N/A) as a surgical intervention.  The patient's history has been reviewed, patient examined, no change in status, stable for surgery.  I have reviewed the patient's chart and labs.  Questions were answered to the patient's satisfaction.     [name]

## 2018-12-10 NOTE — Progress Notes (Addendum)
First void post cath very dark red.  Second void dark pink.  Third void yellow.  Patient complained of left calf pain with flexion of foot.  Pulses faint but palpable, leg normal temperature and color.  Reported to Reino Bellis, NP.

## 2018-12-10 NOTE — Plan of Care (Signed)
  Problem: Clinical Measurements: Goal: Will remain free from infection Outcome: Completed/Met   Problem: Pain Managment: Goal: General experience of comfort will improve Outcome: Completed/Met   Problem: Safety: Goal: Ability to remain free from injury will improve Outcome: Completed/Met   Problem: Education: Goal: Understanding of CV disease, CV risk reduction, and recovery process will improve Outcome: Completed/Met Goal: Individualized Educational Video(s) Outcome: Completed/Met   Problem: Activity: Goal: Ability to return to baseline activity level will improve Outcome: Completed/Met   Problem: Cardiovascular: Goal: Ability to achieve and maintain adequate cardiovascular perfusion will improve Outcome: Completed/Met Goal: Vascular access site(s) Level 0-1 will be maintained Outcome: Completed/Met

## 2018-12-11 DIAGNOSIS — I739 Peripheral vascular disease, unspecified: Secondary | ICD-10-CM

## 2018-12-11 DIAGNOSIS — G4733 Obstructive sleep apnea (adult) (pediatric): Secondary | ICD-10-CM | POA: Diagnosis not present

## 2018-12-11 DIAGNOSIS — Z7902 Long term (current) use of antithrombotics/antiplatelets: Secondary | ICD-10-CM | POA: Diagnosis not present

## 2018-12-11 DIAGNOSIS — Z79899 Other long term (current) drug therapy: Secondary | ICD-10-CM | POA: Diagnosis not present

## 2018-12-11 DIAGNOSIS — Z87891 Personal history of nicotine dependence: Secondary | ICD-10-CM | POA: Diagnosis not present

## 2018-12-11 DIAGNOSIS — Z7982 Long term (current) use of aspirin: Secondary | ICD-10-CM | POA: Diagnosis not present

## 2018-12-11 LAB — CBC
HCT: 37.5 % — ABNORMAL LOW (ref 39.0–52.0)
Hemoglobin: 12.8 g/dL — ABNORMAL LOW (ref 13.0–17.0)
MCH: 31.2 pg (ref 26.0–34.0)
MCHC: 34.1 g/dL (ref 30.0–36.0)
MCV: 91.5 fL (ref 80.0–100.0)
Platelets: 170 10*3/uL (ref 150–400)
RBC: 4.1 MIL/uL — ABNORMAL LOW (ref 4.22–5.81)
RDW: 11.9 % (ref 11.5–15.5)
WBC: 8.6 10*3/uL (ref 4.0–10.5)
nRBC: 0 % (ref 0.0–0.2)

## 2018-12-11 LAB — BASIC METABOLIC PANEL
Anion gap: 5 (ref 5–15)
BUN: 6 mg/dL — ABNORMAL LOW (ref 8–23)
CO2: 29 mmol/L (ref 22–32)
Calcium: 8.8 mg/dL — ABNORMAL LOW (ref 8.9–10.3)
Chloride: 104 mmol/L (ref 98–111)
Creatinine, Ser: 0.79 mg/dL (ref 0.61–1.24)
GFR calc Af Amer: >60 mL/min (ref >60)
GFR calc non Af Amer: >60 mL/min (ref >60)
Glucose, Bld: 113 mg/dL — ABNORMAL HIGH (ref 70–99)
Potassium: 4.1 mmol/L (ref 3.5–5.1)
Sodium: 138 mmol/L (ref 135–145)

## 2018-12-11 MED FILL — Lidocaine Inj 1% w/ Epinephrine-1:100000: INTRAMUSCULAR | Qty: 20 | Status: AC

## 2018-12-11 NOTE — Discharge Summary (Signed)
Discharge Summary    Patient ID: Jorge Jackson,  MRN: [id number], DOB/AGE: 1955-03-22 64 y.o.  Admit date: 12/10/2018 Discharge date: 12/11/2018  Primary Care Provider: [name] Primary Cardiologist: [name], MD  Discharge Diagnoses    Active Problems:   Claudication in peripheral vascular disease [facility])   Allergies No Known Allergies  Diagnostic Studies/Procedures    PV angiogram: 12/10/18  Procedures Performed:               1.  Ultrasound-guided right common femoral access               2.  Contralateral access (secondary catheter placement)               3.  Placement of above-the-knee popliteal NAV 6 distal protection device               4.  Diamondback orbital rotational atherectomy mid left SFA and above-the-knee popliteal artery               5.  Drug-coated balloon angioplasty mid left SFA and above-the-knee popliteal artery               6.  Abbott absolute pro nitinol self-expanding stent left SFA   Final Impression: Successful diamondback orbital rotational atherectomy, drug-coated balloon angioplasty and nitinol stenting of subtotally occluded calcified mid left SFA and above-the-knee left popliteal artery using distal protection for lifestyle limiting claudication.  The patient was already on dual antiplatelet therapy.  The sheath was removed across the bifurcation and exchanged over an 035 wire for a short 7 [place] sheath.  A femoral angiogram was performed and a 6/7 Mynx closure device was then successfully deployed achieving hemostasis.  There were some mild oozing requiring subcutaneous lidocaine/epinephrine injection.  The patient left lab in stable condition.  He will be hydrated overnight, discharged home in the morning.  We will get lower extremity arterial Doppler studies done [facility] next week and I will see him back 2 to 3 weeks thereafter.  [name]. MD, [facility] 12/10/2018 10:21 AM _____________   History of Present Illness      Jorge Jackson is a 63 y.o. with PMH of CAD s/p CABG, HTN, HL and PVD. He lost his job at the end of 2011. He worked at the [employer] in [place] which was outsourced to [place]. He was then hired by a [employer] in March of2017in [place] and now works at [employer].. He had coronary artery bypass grafting in February of 2005 by Dr. Tharon Aquas Trigt with a LIMA to his LAD, a vein to a PDA, OM1 and OM2 sequentially. He remains totally asymptomatic. He has mild left internal carotid artery stenosis which we follow by duplex ultrasound. Dr. Micheal Likens follows his lipid profile. His last Myoview performed January 20, 2011 was nonischemicHe does complain of lethargy and fatigue and relates symptoms compatible with obstructive sleep apnea.He does wear his CPAP.  At his last office visit he related new onset left calf claudication over the last 8 to 12 months occurring at less than 100 yards.  Lower extremity angiography on him 10/11/2018 revealing a right ABI of 1.13 and a left of 0.7.  He did have a high-frequency signal in his mid left SFA and what appeared to be an occluded left tibioperoneal trunk.  He wished to proceed with angiography and potential endovascular therapy.   Hospital Course     Underwent successful diamondback orbital rotational atherectomy, drug-coated  balloon angioplasty and nitinol stenting of subtotally occluded calcified mid left SFA and above-the-knee left popliteal artery using distal protection. Was on DAPT with ASA/plavix prior to admission. Did complain of some left calf pain post intervention but felt to be 2/2 to reperfusion. No complications noted post cath. Morning labs were stable. Able to ambulate without difficulty. Continued on home medications without significant change.   General: Well developed, well nourished, male appearing in no acute distress. Head: Normocephalic, atraumatic.  Neck: Supple without bruits, JVD. Lungs:  Resp regular and unlabored,  CTA. Heart: RRR, S1, S2, no murmur; no rub. Abdomen: Soft, non-tender, non-distended with normoactive bowel sounds. N Extremities: No clubbing, cyanosis, edema. Distal pedal pulses are 2+ bilaterally. Right femoral cath site stable without bruising or hematoma Neuro: Alert and oriented X 3. Moves all extremities spontaneously. Psych: Normal affect.  Jorge Jackson was seen by Dr. Burt Knack and determined stable for discharge home. Follow up in the office has been arranged. Medications are listed below.   _____________  Discharge Vitals Blood pressure 115/69, pulse 87, temperature 98.3 F (36.8 C), temperature source Oral, resp. rate 16, height 5\' 7"  (1.702 m), weight 85 kg, SpO2 100 %.  Filed Weights   12/10/18 0559 12/11/18 0553  Weight: 86.2 kg 85 kg    Labs & Radiologic Studies    CBC Recent Labs    12/11/18 0341  WBC 8.6  HGB 12.8*  HCT 37.5*  MCV 91.5  PLT 123XX123   Basic Metabolic Panel Recent Labs    12/11/18 0341  NA 138  K 4.1  CL 104  CO2 29  GLUCOSE 113*  BUN 6*  CREATININE 0.79  CALCIUM 8.8*   Liver Function Tests No results for input(s): AST, ALT, ALKPHOS, BILITOT, PROT, ALBUMIN in the last 72 hours. No results for input(s): LIPASE, AMYLASE in the last 72 hours. Cardiac Enzymes No results for input(s): CKTOTAL, CKMB, CKMBINDEX, TROPONINI in the last 72 hours. BNP Invalid input(s): POCBNP D-Dimer No results for input(s): DDIMER in the last 72 hours. Hemoglobin A1C No results for input(s): HGBA1C in the last 72 hours. Fasting Lipid Panel No results for input(s): CHOL, HDL, LDLCALC, TRIG, CHOLHDL, LDLDIRECT in the last 72 hours. Thyroid Function Tests No results for input(s): TSH, T4TOTAL, T3FREE, THYROIDAB in the last 72 hours.  Invalid input(s): FREET3 _____________  Vas [place] Abi With/wo Tbi  Result Date: 11/20/2018 LOWER EXTREMITY DOPPLER STUDY Indications: History of PAD with left common iliac artery intervention. Patient              reports  essentially stable left calf claudication symptoms after              walking about 100 yards. He denies rest pain. High Risk Factors: Hyperlipidemia, past history of smoking, prior MI. Other Factors: CABG x 5.  Vascular Interventions: Successful diamondback orbital rotational atherectomy,                         VBX covered stenting of a highly calcified segmental                         left common iliac artery stenosis on 11/05/2018. Comparison Study: In 10/2018, an arterial Doppler showed an ABI of 1.12 on the                   right and .70 on the left. Performing Technologist: Sharlett Iles RVT  Examination Guidelines:  A complete evaluation includes at minimum, Doppler waveform signals and systolic blood pressure reading at the level of bilateral brachial, anterior tibial, and posterior tibial arteries, when vessel segments are accessible. Bilateral testing is considered an integral part of a complete examination. Photoelectric Plethysmograph (PPG) waveforms and toe systolic pressure readings are included as required and additional duplex testing as needed. Limited examinations for reoccurring indications may be performed as noted.  ABI Findings: +---------+------------------+-----+---------+--------+  Right     Rt Pressure (mmHg) Index Waveform  Comment   +---------+------------------+-----+---------+--------+  Brachial  113                                          +---------+------------------+-----+---------+--------+  ATA       133                1.14  triphasic           +---------+------------------+-----+---------+--------+  PTA       130                1.11  triphasic           +---------+------------------+-----+---------+--------+  PERO      120                1.03  triphasic           +---------+------------------+-----+---------+--------+  Great Toe 82                 0.70  Normal              +---------+------------------+-----+---------+--------+  +---------+------------------+-----+-------------------+-------+  Left      Lt Pressure (mmHg) Index Waveform            Comment  +---------+------------------+-----+-------------------+-------+  Brachial  117                                                   +---------+------------------+-----+-------------------+-------+  ATA       59                 0.50  dampened monophasic          +---------+------------------+-----+-------------------+-------+  PTA       86                 0.74  monophasic                   +---------+------------------+-----+-------------------+-------+  PERO      79                 0.68  dampened monophasic          +---------+------------------+-----+-------------------+-------+  Great Toe 63                 0.54  Abnormal                     +---------+------------------+-----+-------------------+-------+ +-------+-----------+-----------+------------+------------+  ABI/TBI Today's ABI Today's TBI Previous ABI Previous TBI  +-------+-----------+-----------+------------+------------+  Right   1.14        .70         1.12         .66           +-------+-----------+-----------+------------+------------+  Left    .74         .  54         .70          .54           +-------+-----------+-----------+------------+------------+ Bilateral ABIs and left TBIs appear essentially unchanged compared to prior study on 10/11/2018. Right TBIs appear increased compared to prior study on 10/11/2018.  Summary: Right: Resting right ankle-brachial index is within normal range. No evidence of significant right lower extremity arterial disease. The right toe-brachial index is normal. Left: Resting left ankle-brachial index indicates moderate left lower extremity arterial disease. The left toe-brachial index is abnormal.  *See table(s) above for measurements and observations. See Aortoiliac duplex report. Suggest follow up study in 6 months. Electronically signed by Larae Grooms MD on 11/20/2018 at 4:21:24 PM.     Final    Vas US Aorta/ivc/iliacs  Result Date: 11/20/2018 ABDOMINAL AORTA STUDY Indications: History of PAD with left common iliac artery intervention. Patient              reports essentially stable left calf claudication symptoms after              walking about 100 yards. He denies rest pain. Risk Factors: Hyperlipidemia, past history of smoking, prior MI. Other Factors: CABG x 5. Vascular Interventions: Successful diamondback orbital rotational atherectomy,                         VBX covered stenting of a highly calcified segmental                         left common iliac artery stenosis on 11/05/2018. Limitations: Air/bowel gas.  Comparison Study: NA Performing Technologist: Sharlett Iles RVT  Examination Guidelines: A complete evaluation includes B-mode imaging, spectral Doppler, color Doppler, and power Doppler as needed of all accessible portions of each vessel. Bilateral testing is considered an integral part of a complete examination. Limited examinations for reoccurring indications may be performed as noted.  Abdominal Aorta Findings: +-------------+-------+----------+----------+---------+--------+--------+  Location      AP (cm) Trans (cm) PSV (cm/s) Waveform  Thrombus Comments  +-------------+-------+----------+----------+---------+--------+--------+  Proximal                         81                                      +-------------+-------+----------+----------+---------+--------+--------+  Mid                              61                                      +-------------+-------+----------+----------+---------+--------+--------+  Distal                           53                                      +-------------+-------+----------+----------+---------+--------+--------+  RT CIA Prox                      104  triphasic                    +-------------+-------+----------+----------+---------+--------+--------+  RT CIA Mid                       131        triphasic                     +-------------+-------+----------+----------+---------+--------+--------+  RT CIA Distal                    133        triphasic                    +-------------+-------+----------+----------+---------+--------+--------+  RT EIA Prox                      147        triphasic                    +-------------+-------+----------+----------+---------+--------+--------+  LT EIA Prox                      93         triphasic                    +-------------+-------+----------+----------+---------+--------+--------+ Aortoiliac atherosclerosis without stenosis, s/p left common iliac artery stent placement. IVC/Iliac Findings: +-------+------+--------+--------+    IVC   Patent Thrombus Comments  +-------+------+--------+--------+  IVC Mid patent                    +-------+------+--------+--------+  Left Stent(s): +-------------------+--------+--------+---------+--------+  Common Iliac Artery PSV cm/s Stenosis Waveform  Comments  +-------------------+--------+--------+---------+--------+  Prox to Stent       97                triphasic           +-------------------+--------+--------+---------+--------+  Proximal Stent      100               triphasic           +-------------------+--------+--------+---------+--------+  Mid Stent           116               triphasic           +-------------------+--------+--------+---------+--------+  Distal Stent        112               triphasic           +-------------------+--------+--------+---------+--------+  Distal to Stent     119               triphasic           +-------------------+--------+--------+---------+--------+ Patent left common iliac artery stent without stenosis.  Summary: Abdominal Aorta: The largest aortic measurement is 2.1 cm. Aortoiliac atherosclerosis without stenosis, s/p left common iliac artery stent placement.  IVC/Iliac: Patent IVC.  *See table(s) above for measurements and observations. See ABI report. Suggest follow up study in 6 months.   Electronically signed by Larae Grooms MD on 11/20/2018 at 4:24:30 PM.    Final    Disposition   Pt is being discharged home today in good condition.  Follow-up Plans & Appointments    Follow-up Information    [facility] Follow up on 12/20/2018.   Specialty: Cardiology Why: at 10am for  your follow up dopplers.  Contact information: [address] [place] [phone]       Lorretta Harp, MD Follow up on 12/25/2018.   Specialties: Cardiology, Radiology Why: at 3:30pm for your follow up appt.  Contact information: [address] [place] [address] [phone]             Discharge Medications     Medication List    TAKE these medications   ALPRAZolam 1 MG tablet Commonly known as: XANAX Take 1 tablet by mouth at bedtime.   aspirin EC 81 MG tablet Take 81 mg by mouth daily.   Cialis 20 MG tablet Generic drug: tadalafil Take 20 mg by mouth daily as needed for erectile dysfunction.   clopidogrel 75 MG tablet Commonly known as: PLAVIX Take 75 mg by mouth daily with breakfast.   metoprolol succinate 50 MG 24 hr tablet Commonly known as: TOPROL-XL Take 75 mg by mouth daily. Take with or immediately following a meal.   nitroGLYCERIN 0.4 MG SL tablet Commonly known as: NITROSTAT Place 0.4 mg under the tongue every 5 (five) minutes as needed for chest pain.   olmesartan 20 MG tablet Commonly known as: BENICAR Take 20 mg by mouth daily.   OMEGA 3 PO Take 950 mg by mouth 2 (two) times daily.   rosuvastatin 40 MG tablet Commonly known as: CRESTOR Take 40 mg by mouth every evening.   SAW PALMETTO PO Take 1 capsule by mouth daily.   tamsulosin 0.4 MG Caps capsule Commonly known as: FLOMAX Take 0.8 mg by mouth every evening.   tiZANidine 4 MG tablet Commonly known as: ZANAFLEX Take 4 mg by mouth daily as needed (spasms (in the evening)).   traMADol 50 MG tablet Commonly  known as: ULTRAM Take 50 mg by mouth 5 (five) times daily as needed (pain.).   ULTRA COQ10 PO Take 1 capsule by mouth daily.       No                               Did the patient have a percutaneous coronary intervention (stent / angioplasty)?:  No.      Outstanding Labs/Studies   Follow up dopplers.  Duration of Discharge Encounter   Greater than 30 minutes including physician time.  Signed, Reino Bellis NP-C 12/11/2018, 8:29 AM   Patient seen, examined. Available data reviewed. Agree with findings, assessment, and plan as outlined by Reino Bellis, NP.  On my exam today, he is alert, oriented, in no distress.  The right groin site is clear with no hematoma or ecchymosis.  The left leg is warm to the foot with no rash.  I reviewed the procedural results from yesterday's lower extremity intervention.  He is having some expected soreness in the left thigh and calf area.  He will follow-up with Dr. [name] as outlined above.  Sherren Mocha, M.D. 12/11/2018 11:22 AM

## 2018-12-11 NOTE — Telephone Encounter (Signed)
Received signed Emerson Monte Form back Dr. Alvester Chou, I then inter-office it back to [facility] to finish processing. cbr

## 2018-12-16 ENCOUNTER — Telehealth: Payer: Self-pay | Admitting: Physician Assistant

## 2018-12-16 NOTE — Telephone Encounter (Signed)
Jorge Jackson is a 63 y.o. male with PAD who underwent L SFA angioplasty and stenting 12/10/2018.  He called today b/c he has noted some bruising around his ankle on the L and has continued to have L calf soreness.  He had the soreness post procedure as well and this was felt to be due to reperfusion.  He has not had severe pain, pallor and his leg is warm.   I reassured him that the bruising and discomfort are related to the procedure.  His pain is likely related to reperfusion.  I advised him to call if it gets worse.  I also advised him to go to the ED if he develops severe pain, pallor or a cold extremity.   Richardson Dopp, PA-C    12/16/2018 8:55 AM

## 2018-12-18 ENCOUNTER — Other Ambulatory Visit: Payer: Self-pay | Admitting: Cardiovascular Disease

## 2018-12-18 DIAGNOSIS — I739 Peripheral vascular disease, unspecified: Secondary | ICD-10-CM

## 2018-12-20 ENCOUNTER — Ambulatory Visit (HOSPITAL_COMMUNITY)
Admission: RE | Admit: 2018-12-20 | Discharge: 2018-12-20 | Disposition: A | Discharge status: Home / Self Care | Payer: BC Managed Care – PPO | Source: Ambulatory Visit | Attending: Cardiology | Admitting: Cardiology

## 2018-12-20 ENCOUNTER — Other Ambulatory Visit: Payer: Self-pay

## 2018-12-20 ENCOUNTER — Encounter (HOSPITAL_COMMUNITY): Payer: BC Managed Care – PPO

## 2018-12-20 ENCOUNTER — Other Ambulatory Visit: Payer: Self-pay | Admitting: Cardiovascular Disease

## 2018-12-20 DIAGNOSIS — I739 Peripheral vascular disease, unspecified: Secondary | ICD-10-CM | POA: Insufficient documentation

## 2018-12-21 ENCOUNTER — Other Ambulatory Visit: Payer: Self-pay | Admitting: *Deleted

## 2018-12-21 DIAGNOSIS — I739 Peripheral vascular disease, unspecified: Secondary | ICD-10-CM

## 2018-12-25 ENCOUNTER — Ambulatory Visit: Payer: BC Managed Care – PPO | Admitting: Cardiovascular Disease

## 2018-12-25 ENCOUNTER — Other Ambulatory Visit: Payer: Self-pay

## 2018-12-25 ENCOUNTER — Encounter: Payer: Self-pay | Admitting: Cardiovascular Disease

## 2018-12-25 VITALS — BP 125/77 | HR 58 | Temp 97.1°F | Ht 67.0 in | Wt 189.2 lb

## 2018-12-25 DIAGNOSIS — I739 Peripheral vascular disease, unspecified: Secondary | ICD-10-CM

## 2018-12-25 DIAGNOSIS — I2510 Atherosclerotic heart disease of native coronary artery without angina pectoris: Secondary | ICD-10-CM

## 2018-12-25 NOTE — Patient Instructions (Signed)
Medication Instructions:  Your physician recommends that you continue on your current medications as directed. Please refer to the Current Medication list given to you today.  If you need a refill on your cardiac medications before your next appointment, please call your pharmacy.    Lab work: NONE  Testing/Procedures: IN 6 MONTHS:  Your physician has requested that you have a lower extremity arterial exercise duplex. During this test, exercise and ultrasound are used to evaluate arterial blood flow in the legs. Allow one hour for this exam. There are no restrictions or special instructions.  AND  Your physician has requested that you have an ankle brachial index (ABI). During this test an ultrasound and blood pressure cuff are used to evaluate the arteries that supply the arms and legs with blood. Allow thirty minutes for this exam. There are no restrictions or special instructions.    Follow-Up: At [facility], you and your health needs are our priority.  As part of our continuing mission to provide you with exceptional heart care, we have created designated Provider Care Teams.  These Care Teams include your primary Cardiologist (physician) and Advanced Practice Providers (APPs -  Physician Assistants and Nurse Practitioners) who all work together to provide you with the care you need, when you need it. You may see [name], MD or one of the following Advanced Practice Providers on your designated Care Team:    Kerin Ransom, PA-C  [place], [place]  Coletta Memos, [place]  Your physician wants you to follow-up in: 6 months after dopplers. You will receive a reminder letter in the mail two months in advance. If you don't receive a letter, please call our office to schedule the follow-up appointment.

## 2018-12-25 NOTE — Assessment & Plan Note (Signed)
History of peripheral arterial disease status post initial angiography 11/05/2018 revealing 95% calcified segmental mid right SFA stenosis with three-vessel runoff, high-grade calcified left common iliac, SFA and popliteal stenosis with occluded left popliteal and one-vessel runoff via the anterior tibial artery.  He underwent orbital atherectomy, PTA and covered stenting using a 8 mm x 59 mm long VBX covered stent 11/05/2018 with staged intervention of his left SFA and popliteal artery 12/10/2018 using CSI, DCB and nitinol stenting.  His Dopplers performed 12/20/2018 revealed a left ABI 0.76 with a patent SFA, monophasic waveforms in the popliteal artery and tibioperoneal trunk.  The stent remained open.  He says that his claudication has significantly improved since his procedure.  He remains on aspirin and Plavix.  Currently his right leg is minimally symptomatic.  We will follow-up Doppler studies in 6 months I will see him back.  If his right leg becomes clinically worse he can be intervened on with a high degree of success.

## 2018-12-25 NOTE — Progress Notes (Signed)
12/25/2018 Jorge Jackson   03-06-1955  [id number]  Primary Physician [name]., MD Primary Cardiologist: Lorretta Harp MD [name], [place], [place]  HPI:  Jorge Jackson is a 63 y.o.  mildly overweight married Caucasian male father of 47, grandfather to 1 grandson who I last sawin the office 11/27/2018... He lost his job at the end of 2011. He worked at the [employer] in [place] which was outsourced to [place]. He was then hired by a [employer] in March of2017in [place] and now works at [employer].. He had coronary artery bypass grafting in February of 2005 by Dr. Tharon Aquas Trigt with a LIMA to his LAD, a vein to a PDA, OM1 and OM2 sequentially. He is totally asymptomatic. He has mild left internal carotid artery stenosis which we follow by duplex ultrasound. Dr. Micheal Likens follows his lipid profile. His last Myoview performed January 20, 2011 was nonischemicHe does complain of lethargy and fatigue and relates symptoms compatible with obstructive sleep apnea.He does wear his CPAP.   Since I saw him 2 years ago he is done well from a cardiac point of view. He denies chest pain or shortness of breath. He does relate new onset left calf claudication over the last 8 to 12 months occurring at less than 100 yards.  I performed lower extremity arterial Doppler studies on him 10/11/2018 revealing a right ABI of 1.13 and a left of 0.7. He did have a high-frequency signal in his mid left SFA and what appeared to be an occluded left tibioperoneal trunk.  I performed peripheral angiography on him 11/05/2018 revealing high-grade calcified left common iliac artery stenosis, bilateral calcified mid at SFA stenoses with an occluded left popliteal artery.  One-vessel runoff on the left and 3 on the right.  I performed diamondback orbital rotational atherectomy, PTA and covered stenting of the entire left common iliac artery with a 8 mm x 59 mm long VBX covered stent.  She had an excellent  angiographic result.  His claudication is minimally improved.  His ABI really has not changed.  The velocities in his left iliac are normal.    He presented on 12/10/2018 for staged intervention of his left SFA and popliteal artery using diamondback orbital atherectomy, drug-coated balloon angioplasty and nitinol self-expanding stenting which was all successful.  He does have an occluded left popliteal artery with a large bridging collateral to an anterior tibial.  His follow-up Doppler studies performed 12/20/2018 revealed a left ABI 0.76 with a patent SFA.  His claudication has markedly improved..   Current Meds  Medication Sig  . ALPRAZolam (XANAX) 1 MG tablet Take 1 tablet by mouth at bedtime.  [name] aspirin EC 81 MG tablet Take 81 mg by mouth daily.  [name] CIALIS 20 MG tablet Take 20 mg by mouth daily as needed for erectile dysfunction.   . clopidogrel (PLAVIX) 75 MG tablet Take 75 mg by mouth daily with breakfast.  . metoprolol succinate (TOPROL-XL) 50 MG 24 hr tablet Take 75 mg by mouth daily. Take with or immediately following a meal.  . nitroGLYCERIN (NITROSTAT) 0.4 MG SL tablet Place 0.4 mg under the tongue every 5 (five) minutes as needed for chest pain.  [name] olmesartan (BENICAR) 20 MG tablet Take 20 mg by mouth daily.  . Omega-3 Fatty Acids (OMEGA 3 PO) Take 950 mg by mouth 2 (two) times daily.  . rosuvastatin (CRESTOR) 40 MG tablet Take 40 mg by mouth every evening.   . Saw  Palmetto, Serenoa repens, (SAW PALMETTO PO) Take 1 capsule by mouth daily.   . tamsulosin (FLOMAX) 0.4 MG CAPS capsule Take 0.8 mg by mouth every evening.   [name] tiZANidine (ZANAFLEX) 4 MG tablet Take 4 mg by mouth daily as needed (spasms (in the evening)).   . traMADol (ULTRAM) 50 MG tablet Take 50 mg by mouth 5 (five) times daily as needed (pain.).   [name] Ubiquinone (ULTRA COQ10 PO) Take 1 capsule by mouth daily.     No Known Allergies  Social History   Socioeconomic History  . Marital status: Married    Spouse name:  Not on file  . Number of children: Not on file  . Years of education: Not on file  . Highest education level: Not on file  Occupational History  . Not on file  Social Needs  . Financial resource strain: Not on file  . Food insecurity    Worry: Not on file    Inability: Not on file  . Transportation needs    Medical: Not on file    Non-medical: Not on file  Tobacco Use  . Smoking status: Former Smoker    Types: Cigarettes    Quit date: 02/01/2003    Years since quitting: 15.9  . Smokeless tobacco: Former Systems developer    Types: Chew  Substance and Sexual Activity  . Alcohol use: Yes    Alcohol/week: 1.0 standard drinks    Types: 1 Cans of beer per week  . Drug use: Not on file  . Sexual activity: Not on file  Lifestyle  . Physical activity    Days per week: Not on file    Minutes per session: Not on file  . Stress: Not on file  Relationships  . Social connections    Talks on phone: More than three times a week    Gets together: Not on file    Attends religious service: Not on file    Active member of club or organization: Not on file    Attends meetings of clubs or organizations: Not on file    Relationship status: Not on file  . Intimate partner violence    Fear of current or ex partner: No    Emotionally abused: No    Physically abused: No    Forced sexual activity: No  Other Topics Concern  . Not on file  Social History Narrative  . Not on file     Review of Systems: General: negative for chills, fever, night sweats or weight changes.  Cardiovascular: negative for chest pain, dyspnea on exertion, edema, orthopnea, palpitations, paroxysmal nocturnal dyspnea or shortness of breath Dermatological: negative for rash Respiratory: negative for cough or wheezing Urologic: negative for hematuria Abdominal: negative for nausea, vomiting, diarrhea, bright red blood per rectum, melena, or hematemesis Neurologic: negative for visual changes, syncope, or dizziness All other  systems reviewed and are otherwise negative except as noted above.    Blood pressure 125/77, pulse (!) 58, temperature (!) 97.1 F (36.2 C), height 5\' 7"  (1.702 m), weight 189 lb 3.2 oz (85.8 kg), SpO2 99 %.  General appearance: alert and no distress Neck: no adenopathy, no carotid bruit, no JVD, supple, symmetrical, trachea midline and thyroid not enlarged, symmetric, no tenderness/mass/nodules Lungs: clear to auscultation bilaterally Heart: regular rate and rhythm, S1, S2 normal, no murmur, click, rub or gallop Extremities: extremities normal, atraumatic, no cyanosis or edema Pulses: 2+ and symmetric Skin: Skin color, texture, turgor normal. No rashes or lesions Neurologic: Alert  and oriented X 3, normal strength and tone. Normal symmetric reflexes. Normal coordination and gait  EKG not performed today  ASSESSMENT AND PLAN:   Claudication in peripheral vascular disease ([place]) History of peripheral arterial disease status post initial angiography 11/05/2018 revealing 95% calcified segmental mid right SFA stenosis with three-vessel runoff, high-grade calcified left common iliac, SFA and popliteal stenosis with occluded left popliteal and one-vessel runoff via the anterior tibial artery.  He underwent orbital atherectomy, PTA and covered stenting using a 8 mm x 59 mm long VBX covered stent 11/05/2018 with staged intervention of his left SFA and popliteal artery 12/10/2018 using CSI, DCB and nitinol stenting.  His Dopplers performed 12/20/2018 revealed a left ABI 0.76 with a patent SFA, monophasic waveforms in the popliteal artery and tibioperoneal trunk.  The stent remained open.  He says that his claudication has significantly improved since his procedure.  He remains on aspirin and Plavix.  Currently his right leg is minimally symptomatic.  We will follow-up Doppler studies in 6 months I will see him back.  If his right leg becomes clinically worse he can be intervened on with a high degree of  success.      Lorretta Harp MD FACP,FACC,FAHA, [facility] 12/25/2018 4:01 PM

## 2019-01-17 DIAGNOSIS — M542 Cervicalgia: Secondary | ICD-10-CM | POA: Diagnosis not present

## 2019-01-17 DIAGNOSIS — Z6830 Body mass index (BMI) 30.0-30.9, adult: Secondary | ICD-10-CM | POA: Diagnosis not present

## 2019-02-10 DIAGNOSIS — N3091 Cystitis, unspecified with hematuria: Secondary | ICD-10-CM | POA: Diagnosis not present

## 2019-03-01 DIAGNOSIS — Z0000 Encounter for general adult medical examination without abnormal findings: Secondary | ICD-10-CM | POA: Diagnosis not present

## 2019-03-01 DIAGNOSIS — E785 Hyperlipidemia, unspecified: Secondary | ICD-10-CM | POA: Diagnosis not present

## 2019-03-01 DIAGNOSIS — Z125 Encounter for screening for malignant neoplasm of prostate: Secondary | ICD-10-CM | POA: Diagnosis not present

## 2019-03-01 DIAGNOSIS — F419 Anxiety disorder, unspecified: Secondary | ICD-10-CM | POA: Diagnosis not present

## 2019-03-01 DIAGNOSIS — M1990 Unspecified osteoarthritis, unspecified site: Secondary | ICD-10-CM | POA: Diagnosis not present

## 2019-03-01 DIAGNOSIS — Z79899 Other long term (current) drug therapy: Secondary | ICD-10-CM | POA: Diagnosis not present

## 2019-06-12 DIAGNOSIS — G4733 Obstructive sleep apnea (adult) (pediatric): Secondary | ICD-10-CM | POA: Diagnosis not present

## 2019-06-17 ENCOUNTER — Other Ambulatory Visit: Payer: Self-pay

## 2019-06-17 ENCOUNTER — Other Ambulatory Visit (HOSPITAL_COMMUNITY): Payer: Self-pay | Admitting: Cardiovascular Disease

## 2019-06-17 ENCOUNTER — Ambulatory Visit (HOSPITAL_COMMUNITY)
Admission: RE | Admit: 2019-06-17 | Discharge: 2019-06-17 | Disposition: A | Discharge status: Home / Self Care | Payer: BC Managed Care – PPO | Source: Ambulatory Visit | Attending: Cardiovascular Disease | Admitting: Cardiovascular Disease

## 2019-06-17 DIAGNOSIS — I739 Peripheral vascular disease, unspecified: Secondary | ICD-10-CM

## 2019-06-17 DIAGNOSIS — Z95820 Peripheral vascular angioplasty status with implants and grafts: Secondary | ICD-10-CM

## 2019-06-17 DIAGNOSIS — Z95828 Presence of other vascular implants and grafts: Secondary | ICD-10-CM

## 2019-06-17 DIAGNOSIS — I2510 Atherosclerotic heart disease of native coronary artery without angina pectoris: Secondary | ICD-10-CM | POA: Diagnosis not present

## 2019-06-17 DIAGNOSIS — Z9862 Peripheral vascular angioplasty status: Secondary | ICD-10-CM

## 2019-06-25 ENCOUNTER — Encounter: Payer: Self-pay | Admitting: Cardiovascular Disease

## 2019-06-25 ENCOUNTER — Other Ambulatory Visit: Payer: Self-pay

## 2019-06-25 ENCOUNTER — Ambulatory Visit: Payer: BC Managed Care – PPO | Admitting: Cardiovascular Disease

## 2019-06-25 DIAGNOSIS — I2510 Atherosclerotic heart disease of native coronary artery without angina pectoris: Secondary | ICD-10-CM | POA: Diagnosis not present

## 2019-06-25 DIAGNOSIS — I779 Disorder of arteries and arterioles, unspecified: Secondary | ICD-10-CM

## 2019-06-25 DIAGNOSIS — E785 Hyperlipidemia, unspecified: Secondary | ICD-10-CM | POA: Diagnosis not present

## 2019-06-25 DIAGNOSIS — I739 Peripheral vascular disease, unspecified: Secondary | ICD-10-CM | POA: Diagnosis not present

## 2019-06-25 NOTE — Assessment & Plan Note (Signed)
History of PAD status post left common iliac orbital atherectomy and VBX covered stenting 11/05/2018 with staged intervention on his proximal left SFA and distal left SFA 12/10/2018.  He does have a known occluded left popliteal artery with a large bridging collateral and three-vessel runoff.  He has 95% calcified mid right SFA stenosis.  He is currently asymptomatic and Dopplers recently performed showed patent iliac and SFA stents.

## 2019-06-25 NOTE — Assessment & Plan Note (Signed)
History of CAD status post CABG by Dr. Darcey Nora February 2005 with a LIMA to his LAD, vein to the PDA, OM1 and to sequentially.  He is totally asymptomatic from this.

## 2019-06-25 NOTE — Assessment & Plan Note (Signed)
History of moderate bilateral ICA stenosis by duplex ultrasound 10/11/2018.  Will repeat carotid Doppler studies in September of this year.

## 2019-06-25 NOTE — Assessment & Plan Note (Signed)
History of hyperlipidemia on statin therapy with lipid profile performed 03/01/2019 revealing a total cholesterol of 126, LDL 51 and HDL 59.

## 2019-06-25 NOTE — Patient Instructions (Addendum)
Medication Instructions:  Your physician recommends that you continue on your current medications as directed. Please refer to the Current Medication list given to you today.  *If you need a refill on your cardiac medications before your next appointment, please call your pharmacy*   Testing/Procedures:  IN September: Your physician has requested that you have a carotid duplex. This test is an ultrasound of the carotid arteries in your neck. It looks at blood flow through these arteries that supply the brain with blood. Allow one hour for this exam. There are no restrictions or special instructions.   IN 1 YEAR: Your physician has requested that you have a aorta and iliac duplex. During this test, an ultrasound is used to evaluate blood flow to the aorta and iliac arteries. Allow one hour for this exam. Do not eat after midnight the day before and avoid carbonated beverages.   Your physician has requested that you have a lower extremity arterial duplex. During this test, ultrasound is used to evaluate arterial blood flow in the legs. Allow one hour for this exam. There are no restrictions or special instructions.  Your physician has requested that you have an ankle brachial index (ABI). During this test an ultrasound and blood pressure cuff are used to evaluate the arteries that supply the arms and legs with blood. Allow thirty minutes for this exam. There are no restrictions or special instructions.   Follow-Up: At [facility], you and your health needs are our priority.  As part of our continuing mission to provide you with exceptional heart care, we have created designated Provider Care Teams.  These Care Teams include your primary Cardiologist (physician) and Advanced Practice Providers (APPs -  Physician Assistants and Nurse Practitioners) who all work together to provide you with the care you need, when you need it.  We recommend signing up for the patient portal called "MyChart".   Sign up information is provided on this After Visit Summary.  MyChart is used to connect with patients for Virtual Visits (Telemedicine).  Patients are able to view lab/test results, encounter notes, upcoming appointments, etc.  Non-urgent messages can be sent to your provider as well.   To learn more about what you can do with MyChart, go to [email or web address].    Your next appointment:   12 month(s)  The format for your next appointment:   In Person  Provider:   You may see [name], MD or one of the following Advanced Practice Providers on your designated Care Team:    Kerin Ransom, PA-C  [place], [place]  Coletta Memos, [place]    Other Instructions Please call our office 2 months in advance to schedule your follow-up appointment with Dr. [name].

## 2019-06-25 NOTE — Progress Notes (Signed)
06/25/2019 Jorge Jackson   06-19-1955  [id number]  Primary Physician Penelope Coop, FNP Primary Cardiologist: Lorretta Harp MD [name], [place], [place]  HPI:  Jorge Jackson is a 64 y.o.  mildly overweight married Caucasian male father of 62, grandfather to 1 grandson who I last sawin the office 12/25/2018... He lost his job at the end of 2011. He worked at the [employer] in [place] which was outsourced to [place]. He was then hired by a [employer] in March of2017in [place] and now works at [employer].. He had coronary artery bypass grafting in February of 2005 by Dr. Tharon Aquas Trigt with a LIMA to his LAD, a vein to a PDA, OM1 and OM2 sequentially. He is totally asymptomatic. He has mild left internal carotid artery stenosis which we follow by duplex ultrasound. Dr. Micheal Likens follows his lipid profile. His last Myoview performed January 20, 2011 was nonischemicHe does complain of lethargy and fatigue and relates symptoms compatible with obstructive sleep apnea.He does wear his CPAP.   Since I saw him 2 years ago he is done well from a cardiac point of view. He denies chest pain or shortness of breath. He does relate new onset left calf claudication over the last 8 to 12 months occurring at less than 100 yards.  I performedlower extremity arterial Doppler studieson him 10/11/2018 revealing a right ABI of 1.13 and a left of 0.7. He did have a high-frequency signal in his mid left SFA and what appeared to be an occluded left tibioperoneal trunk.  I performed peripheral angiography on him 11/05/2018 revealing high-grade calcified left common iliac artery stenosis, bilateral calcified mid at SFA stenoses with an occluded left popliteal artery. One-vessel runoff on the left and 3 on the right. I performed diamondback orbital rotational atherectomy, PTA and covered stenting of the entire left common iliac artery with a 8 mm x 59 mm long VBX covered stent. She had an excellent  angiographic result. His claudication is minimally improved. His ABI really has not changed. The velocities in his left iliac are normal.   He presented on 12/10/2018 for staged intervention of his left SFA and popliteal artery using diamondback orbital atherectomy, drug-coated balloon angioplasty and nitinol self-expanding stenting which was all successful.  He does have an occluded left popliteal artery with a large bridging collateral to an anterior tibial.  His follow-up Doppler studies performed 12/20/2018 revealed a left ABI 0.76 with a patent SFA.  His claudication had markedly improved.  Since I saw him 6 months ago he continues to do well.  He denies claudication in either extremity.  His Dopplers have remained stable.  He has an excellent lipid profile.  He does not smoke.  He denies chest pain or shortness of breath.   Current Meds  Medication Sig  . ALPRAZolam (XANAX) 1 MG tablet Take 1 tablet by mouth at bedtime.  [name] aspirin EC 81 MG tablet Take 81 mg by mouth daily.  [name] CIALIS 20 MG tablet Take 20 mg by mouth daily as needed for erectile dysfunction.   . clopidogrel (PLAVIX) 75 MG tablet Take 75 mg by mouth daily with breakfast.  . co-enzyme Q-10 30 MG capsule Take by mouth.  . metoprolol succinate (TOPROL-XL) 50 MG 24 hr tablet Take 75 mg by mouth daily. Take with or immediately following a meal.  . nitroGLYCERIN (NITROSTAT) 0.4 MG SL tablet Place 0.4 mg under the tongue every 5 (five) minutes as needed for chest pain.  [name]  olmesartan (BENICAR) 20 MG tablet Take 20 mg by mouth daily.  . Omega-3 Fatty Acids (OMEGA 3 PO) Take 950 mg by mouth 2 (two) times daily.  . rosuvastatin (CRESTOR) 40 MG tablet Take 40 mg by mouth every evening.   . Saw Palmetto, Serenoa repens, (SAW PALMETTO PO) Take 1 capsule by mouth daily.   . tamsulosin (FLOMAX) 0.4 MG CAPS capsule Take 0.8 mg by mouth every evening.   [name] tiZANidine (ZANAFLEX) 4 MG tablet Take 4 mg by mouth daily as needed (spasms (in the  evening)).   . traMADol (ULTRAM) 50 MG tablet Take 50 mg by mouth 5 (five) times daily as needed (pain.).   [name] Ubiquinone (ULTRA COQ10 PO) Take 1 capsule by mouth daily.  . [DISCONTINUED] Saw Palmetto, Serenoa repens, (SAW PALMETTO PO) Take by mouth.     No Known Allergies  Social History   Socioeconomic History  . Marital status: Married    Spouse name: Not on file  . Number of children: Not on file  . Years of education: Not on file  . Highest education level: Not on file  Occupational History  . Not on file  Tobacco Use  . Smoking status: Former Smoker    Types: Cigarettes    Quit date: 02/01/2003    Years since quitting: 16.4  . Smokeless tobacco: Former Systems developer    Types: Chew  Substance and Sexual Activity  . Alcohol use: Yes    Alcohol/week: 1.0 standard drinks    Types: 1 Cans of beer per week  . Drug use: Not on file  . Sexual activity: Not on file  Other Topics Concern  . Not on file  Social History Narrative  . Not on file   Social Determinants of Health   Financial Resource Strain:   . Difficulty of Paying Living Expenses:   Food Insecurity:   . Worried About Charity fundraiser in the Last Year:   . Arboriculturist in the Last Year:   Transportation Needs:   . Film/video editor (Medical):   [name] Lack of Transportation (Non-Medical):   Physical Activity:   . Days of Exercise per Week:   . Minutes of Exercise per Session:   Stress:   . Feeling of Stress :   Social Connections: Unknown  . Frequency of Communication with Friends and Family: More than three times a week  . Frequency of Social Gatherings with Friends and Family: Not on file  . Attends Religious Services: Not on file  . Active Member of Clubs or Organizations: Not on file  . Attends Archivist Meetings: Not on file  . Marital Status: Not on file  Intimate Partner Violence: Not At Risk  . Fear of Current or Ex-Partner: No  . Emotionally Abused: No  . Physically Abused: No  .  Sexually Abused: No     Review of Systems: General: negative for chills, fever, night sweats or weight changes.  Cardiovascular: negative for chest pain, dyspnea on exertion, edema, orthopnea, palpitations, paroxysmal nocturnal dyspnea or shortness of breath Dermatological: negative for rash Respiratory: negative for cough or wheezing Urologic: negative for hematuria Abdominal: negative for nausea, vomiting, diarrhea, bright red blood per rectum, melena, or hematemesis Neurologic: negative for visual changes, syncope, or dizziness All other systems reviewed and are otherwise negative except as noted above.    Blood pressure 120/80, pulse 77, height 5\' 7"  (1.702 m), weight 192 lb (87.1 kg), SpO2 97 %.  General appearance:  alert and no distress Neck: no adenopathy, no carotid bruit, no JVD, supple, symmetrical, trachea midline and thyroid not enlarged, symmetric, no tenderness/mass/nodules Lungs: clear to auscultation bilaterally Heart: regular rate and rhythm, S1, S2 normal, no murmur, click, rub or gallop Extremities: extremities normal, atraumatic, no cyanosis or edema Pulses: 2+ and symmetric Skin: Skin color, texture, turgor normal. No rashes or lesions Neurologic: Alert and oriented X 3, normal strength and tone. Normal symmetric reflexes. Normal coordination and gait  EKG normal sinus rhythm at 72 with left axis deviation.  I personally reviewed this EKG.  ASSESSMENT AND PLAN:   CAD CABG X 5 '05. Low risk Myoview 12/12 History of CAD status post CABG by Dr. Darcey Nora February 2005 with a LIMA to his LAD, vein to the PDA, OM1 and to sequentially.  He is totally asymptomatic from this.  Hyperlipidemia LDL goal <70 History of hyperlipidemia on statin therapy with lipid profile performed 03/01/2019 revealing a total cholesterol of 126, LDL 51 and HDL 59.  Mild carotid artery disease History of moderate bilateral ICA stenosis by duplex ultrasound 10/11/2018.  Will repeat carotid  Doppler studies in September of this year.  Claudication in peripheral vascular disease ([place]) History of PAD status post left common iliac orbital atherectomy and VBX covered stenting 11/05/2018 with staged intervention on his proximal left SFA and distal left SFA 12/10/2018.  He does have a known occluded left popliteal artery with a large bridging collateral and three-vessel runoff.  He has 95% calcified mid right SFA stenosis.  He is currently asymptomatic and Dopplers recently performed showed patent iliac and SFA stents.      Lorretta Harp MD FACP,FACC,FAHA, [facility] 06/25/2019 10:57 AM

## 2019-08-30 DIAGNOSIS — E785 Hyperlipidemia, unspecified: Secondary | ICD-10-CM | POA: Diagnosis not present

## 2019-08-30 DIAGNOSIS — R739 Hyperglycemia, unspecified: Secondary | ICD-10-CM | POA: Diagnosis not present

## 2019-08-30 DIAGNOSIS — Z6829 Body mass index (BMI) 29.0-29.9, adult: Secondary | ICD-10-CM | POA: Diagnosis not present

## 2019-08-30 DIAGNOSIS — F419 Anxiety disorder, unspecified: Secondary | ICD-10-CM | POA: Diagnosis not present

## 2019-08-30 DIAGNOSIS — M1990 Unspecified osteoarthritis, unspecified site: Secondary | ICD-10-CM | POA: Diagnosis not present

## 2019-10-24 ENCOUNTER — Inpatient Hospital Stay (HOSPITAL_COMMUNITY): Admission: RE | Admit: 2019-10-24 | Payer: BC Managed Care – PPO | Source: Ambulatory Visit

## 2019-10-28 ENCOUNTER — Ambulatory Visit (HOSPITAL_COMMUNITY): Payer: BC Managed Care – PPO

## 2019-11-01 ENCOUNTER — Other Ambulatory Visit: Payer: Self-pay

## 2019-11-01 ENCOUNTER — Ambulatory Visit (HOSPITAL_COMMUNITY)
Admission: RE | Admit: 2019-11-01 | Discharge: 2019-11-01 | Disposition: A | Discharge status: Home / Self Care | Payer: BC Managed Care – PPO | Source: Ambulatory Visit | Attending: Cardiovascular Disease | Admitting: Cardiovascular Disease

## 2019-11-01 ENCOUNTER — Other Ambulatory Visit (HOSPITAL_COMMUNITY): Payer: Self-pay | Admitting: Cardiovascular Disease

## 2019-11-01 DIAGNOSIS — I779 Disorder of arteries and arterioles, unspecified: Secondary | ICD-10-CM

## 2019-11-01 DIAGNOSIS — I6523 Occlusion and stenosis of bilateral carotid arteries: Secondary | ICD-10-CM

## 2019-11-04 ENCOUNTER — Telehealth: Payer: Self-pay

## 2019-11-04 DIAGNOSIS — R0989 Other specified symptoms and signs involving the circulatory and respiratory systems: Secondary | ICD-10-CM

## 2019-11-04 NOTE — Telephone Encounter (Signed)
Patient notified of the following results.   Lorretta Harp, MD  11/02/2019 4:38 PM EDT Back to Top    No change from prior study. Repeat in 12 months.    Patient verbalized understanding. Orders placed for repeat in 44months.

## 2019-12-23 DIAGNOSIS — Z23 Encounter for immunization: Secondary | ICD-10-CM | POA: Diagnosis not present

## 2020-01-19 DIAGNOSIS — Z20828 Contact with and (suspected) exposure to other viral communicable diseases: Secondary | ICD-10-CM | POA: Diagnosis not present

## 2020-01-19 DIAGNOSIS — R319 Hematuria, unspecified: Secondary | ICD-10-CM | POA: Diagnosis not present

## 2020-05-19 DIAGNOSIS — Z79899 Other long term (current) drug therapy: Secondary | ICD-10-CM | POA: Diagnosis not present

## 2020-05-19 DIAGNOSIS — Z125 Encounter for screening for malignant neoplasm of prostate: Secondary | ICD-10-CM | POA: Diagnosis not present

## 2020-05-19 DIAGNOSIS — E785 Hyperlipidemia, unspecified: Secondary | ICD-10-CM | POA: Diagnosis not present

## 2020-05-19 DIAGNOSIS — R739 Hyperglycemia, unspecified: Secondary | ICD-10-CM | POA: Diagnosis not present

## 2020-05-19 DIAGNOSIS — Z1331 Encounter for screening for depression: Secondary | ICD-10-CM | POA: Diagnosis not present

## 2020-05-19 DIAGNOSIS — Z0000 Encounter for general adult medical examination without abnormal findings: Secondary | ICD-10-CM | POA: Diagnosis not present

## 2020-05-19 DIAGNOSIS — Z6830 Body mass index (BMI) 30.0-30.9, adult: Secondary | ICD-10-CM | POA: Diagnosis not present

## 2020-05-21 DIAGNOSIS — G4733 Obstructive sleep apnea (adult) (pediatric): Secondary | ICD-10-CM | POA: Diagnosis not present

## 2020-06-24 ENCOUNTER — Ambulatory Visit (HOSPITAL_COMMUNITY)
Admission: RE | Admit: 2020-06-24 | Discharge: 2020-06-24 | Disposition: A | Discharge status: Home / Self Care | Payer: BC Managed Care – PPO | Source: Ambulatory Visit | Attending: Cardiovascular Disease | Admitting: Cardiovascular Disease

## 2020-06-24 ENCOUNTER — Other Ambulatory Visit (HOSPITAL_COMMUNITY): Payer: Self-pay | Admitting: Cardiovascular Disease

## 2020-06-24 ENCOUNTER — Ambulatory Visit (HOSPITAL_BASED_OUTPATIENT_CLINIC_OR_DEPARTMENT_OTHER)
Admission: RE | Admit: 2020-06-24 | Discharge: 2020-06-24 | Disposition: A | Discharge status: Home / Self Care | Payer: BC Managed Care – PPO | Source: Ambulatory Visit | Attending: Cardiovascular Disease | Admitting: Cardiovascular Disease

## 2020-06-24 ENCOUNTER — Other Ambulatory Visit: Payer: Self-pay

## 2020-06-24 DIAGNOSIS — Z95820 Peripheral vascular angioplasty status with implants and grafts: Secondary | ICD-10-CM | POA: Diagnosis not present

## 2020-06-24 DIAGNOSIS — Z9862 Peripheral vascular angioplasty status: Secondary | ICD-10-CM

## 2020-06-24 DIAGNOSIS — I739 Peripheral vascular disease, unspecified: Secondary | ICD-10-CM

## 2020-06-24 DIAGNOSIS — Z95828 Presence of other vascular implants and grafts: Secondary | ICD-10-CM | POA: Insufficient documentation

## 2020-10-30 DIAGNOSIS — Z79899 Other long term (current) drug therapy: Secondary | ICD-10-CM | POA: Diagnosis not present

## 2020-10-30 DIAGNOSIS — I2510 Atherosclerotic heart disease of native coronary artery without angina pectoris: Secondary | ICD-10-CM | POA: Diagnosis not present

## 2020-10-30 DIAGNOSIS — R7303 Prediabetes: Secondary | ICD-10-CM | POA: Diagnosis not present

## 2020-10-30 DIAGNOSIS — I10 Essential (primary) hypertension: Secondary | ICD-10-CM | POA: Diagnosis not present

## 2020-10-30 DIAGNOSIS — E559 Vitamin D deficiency, unspecified: Secondary | ICD-10-CM | POA: Diagnosis not present

## 2020-10-30 DIAGNOSIS — M2550 Pain in unspecified joint: Secondary | ICD-10-CM | POA: Diagnosis not present

## 2020-10-30 DIAGNOSIS — E782 Mixed hyperlipidemia: Secondary | ICD-10-CM | POA: Diagnosis not present

## 2020-11-03 ENCOUNTER — Ambulatory Visit (HOSPITAL_COMMUNITY)
Admission: RE | Admit: 2020-11-03 | Discharge: 2020-11-03 | Disposition: A | Discharge status: Home / Self Care | Payer: BC Managed Care – PPO | Source: Ambulatory Visit | Attending: Cardiovascular Disease | Admitting: Cardiovascular Disease

## 2020-11-03 ENCOUNTER — Other Ambulatory Visit: Payer: Self-pay

## 2020-11-03 DIAGNOSIS — R0989 Other specified symptoms and signs involving the circulatory and respiratory systems: Secondary | ICD-10-CM | POA: Insufficient documentation

## 2020-12-30 ENCOUNTER — Other Ambulatory Visit (HOSPITAL_COMMUNITY): Payer: Self-pay | Admitting: Cardiovascular Disease

## 2020-12-30 DIAGNOSIS — I6523 Occlusion and stenosis of bilateral carotid arteries: Secondary | ICD-10-CM

## 2021-02-12 DIAGNOSIS — G4733 Obstructive sleep apnea (adult) (pediatric): Secondary | ICD-10-CM | POA: Diagnosis not present

## 2021-03-04 DIAGNOSIS — Z125 Encounter for screening for malignant neoplasm of prostate: Secondary | ICD-10-CM | POA: Diagnosis not present

## 2021-03-04 DIAGNOSIS — Z6830 Body mass index (BMI) 30.0-30.9, adult: Secondary | ICD-10-CM | POA: Diagnosis not present

## 2021-03-04 DIAGNOSIS — M545 Low back pain, unspecified: Secondary | ICD-10-CM | POA: Diagnosis not present

## 2021-03-04 DIAGNOSIS — F419 Anxiety disorder, unspecified: Secondary | ICD-10-CM | POA: Diagnosis not present

## 2021-03-04 DIAGNOSIS — Z7689 Persons encountering health services in other specified circumstances: Secondary | ICD-10-CM | POA: Diagnosis not present

## 2021-03-04 DIAGNOSIS — M47816 Spondylosis without myelopathy or radiculopathy, lumbar region: Secondary | ICD-10-CM | POA: Diagnosis not present

## 2021-03-04 DIAGNOSIS — M5136 Other intervertebral disc degeneration, lumbar region: Secondary | ICD-10-CM | POA: Diagnosis not present

## 2021-03-04 DIAGNOSIS — E785 Hyperlipidemia, unspecified: Secondary | ICD-10-CM | POA: Diagnosis not present

## 2021-03-04 DIAGNOSIS — I10 Essential (primary) hypertension: Secondary | ICD-10-CM | POA: Diagnosis not present

## 2021-03-04 DIAGNOSIS — M2578 Osteophyte, vertebrae: Secondary | ICD-10-CM | POA: Diagnosis not present

## 2021-03-18 DIAGNOSIS — Z6830 Body mass index (BMI) 30.0-30.9, adult: Secondary | ICD-10-CM | POA: Diagnosis not present

## 2021-03-18 DIAGNOSIS — E785 Hyperlipidemia, unspecified: Secondary | ICD-10-CM | POA: Diagnosis not present

## 2021-03-18 DIAGNOSIS — F419 Anxiety disorder, unspecified: Secondary | ICD-10-CM | POA: Diagnosis not present

## 2021-03-18 DIAGNOSIS — I10 Essential (primary) hypertension: Secondary | ICD-10-CM | POA: Diagnosis not present

## 2021-04-22 ENCOUNTER — Other Ambulatory Visit: Payer: Self-pay

## 2021-04-22 ENCOUNTER — Encounter: Payer: Self-pay | Admitting: Cardiovascular Disease

## 2021-04-22 ENCOUNTER — Ambulatory Visit: Payer: BC Managed Care – PPO | Admitting: Cardiovascular Disease

## 2021-04-22 DIAGNOSIS — E785 Hyperlipidemia, unspecified: Secondary | ICD-10-CM

## 2021-04-22 DIAGNOSIS — I779 Disorder of arteries and arterioles, unspecified: Secondary | ICD-10-CM

## 2021-04-22 DIAGNOSIS — I2510 Atherosclerotic heart disease of native coronary artery without angina pectoris: Secondary | ICD-10-CM

## 2021-04-22 DIAGNOSIS — I739 Peripheral vascular disease, unspecified: Secondary | ICD-10-CM

## 2021-04-22 NOTE — Assessment & Plan Note (Signed)
History of hyperlipidemia on statin therapy with lipid profile performed 03/04/2021 revealing total cholesterol 130, LDL 59 and HDL 48. ?

## 2021-04-22 NOTE — Assessment & Plan Note (Signed)
History of peripheral arterial disease status post left iliac stenting by myself 11/05/2018 with an 8 mm x 59 mm long VBX covered stent.  He did have a 60% ostial right common iliac artery stenosis and high-grade 95% calcified bilateral SFA disease.  He also has an occluded left popliteal artery with one-vessel runoff via the anterior tibial.  I brought him back on 12/10/2018 for staged intervention of his left SFA.  I performed diamondback orbital rotational atherectomy, drug-coated balloon angioplasty with nitinol self-expanding stenting with excellent result.  He still had a residual occluded popliteal artery with a large bridging collateral.  His Dopplers improved as did his claudication.  He currently denies claudication in either leg. ?

## 2021-04-22 NOTE — Assessment & Plan Note (Signed)
History of coronary artery bypass grafting by Dr. Dahlia Byes in February 2005 with a LIMA to the LAD, vein to a PDA, OM1 and OM 2 sequentially.  He had a Myoview performed in 2012 that was nonischemic.  He denies chest pain or shortness of breath. ?

## 2021-04-22 NOTE — Assessment & Plan Note (Signed)
History of moderate bilateral ICA stenosis by duplex ultrasound recently performed 11/03/2020.  This will be repeated on an annual basis. ?

## 2021-04-22 NOTE — Progress Notes (Signed)
? ? ? ?04/22/2021 ?Jorge Jackson   ?06-30-1955  ?[id number] ? ?Primary Physician Jacelyn Pi, Lilia Argue, MD ?Primary Cardiologist: Lorretta Harp MD [name], [place] ? ?HPI:  Jorge Jackson is a 66 y.o.  mildly overweight married Caucasian male father of 30, grandfather to 1 grandson who I last saw in the office 06/25/2019... He lost his job at the end of 2011. He worked at the [employer] in [place] which was outsourced to [place]. He was then hired by a [employer] in March of 2017 in [place] and now works at [employer] bus.. He had coronary artery bypass grafting in February of 2005 by Dr. Tharon Aquas Trigt with a LIMA to his LAD, a vein to a PDA, OM1 and OM2 sequentially. He is totally asymptomatic. He has mild left internal carotid artery stenosis which we follow by duplex ultrasound. Dr. Micheal Likens follows his lipid profile. His last Myoview performed January 20, 2011 was nonischemic He does complain of lethargy and fatigue and relates symptoms compatible with obstructive sleep apnea. He does wear his CPAP. ?  ?  ?Since I saw him 2 years ago he is done well from a cardiac point of view.  He denies chest pain or shortness of breath.  He does relate new onset left calf claudication over the last 8 to 12 months occurring at less than 100 yards. ?  ?I performed lower extremity arterial Doppler studies on him 10/11/2018 revealing a right ABI of 1.13 and a left of 0.7.  He did have a high-frequency signal in his mid left SFA and what appeared to be an occluded left tibioperoneal trunk. ?  ?I performed peripheral angiography on him 11/05/2018 revealing high-grade calcified left common iliac artery stenosis, bilateral calcified mid at SFA stenoses with an occluded left popliteal artery.  One-vessel runoff on the left and 3 on the right.  I performed diamondback orbital rotational atherectomy, PTA and covered stenting of the entire left common iliac artery with a 8 mm x 59 mm long VBX covered stent.  She had an excellent  angiographic result.  His claudication is minimally improved.  His ABI really has not changed.  The velocities in his left iliac are normal.   ?  ?He presented on 12/10/2018 for staged intervention of his left SFA and popliteal artery using diamondback orbital atherectomy, drug-coated balloon angioplasty and nitinol self-expanding stenting which was all successful.  He does have an occluded left popliteal artery with a large bridging collateral to an anterior tibial.  His follow-up Doppler studies performed 12/20/2018 revealed a left ABI 0.76 with a patent SFA.  His claudication had markedly improved. ?  ?Since I saw him 2 years ago he continues to do well.  He denies claudication in either extremity.  His Dopplers have remained stable.  He has an excellent lipid profile.  He does not smoke.  He denies chest pain or shortness of breath.  He does have bilateral ICA stenosis by duplex ultrasound performed 11/03/2020 which we are following noninvasively as well. ? ? ?Current Meds  ?Medication Sig  ? aspirin EC 81 MG tablet Take 81 mg by mouth daily.  ? CIALIS 20 MG tablet Take 20 mg by mouth daily as needed for erectile dysfunction.   ? clopidogrel (PLAVIX) 75 MG tablet Take 75 mg by mouth daily with breakfast.  ? co-enzyme Q-10 30 MG capsule Take by mouth.  ? hydrOXYzine (VISTARIL) 25 MG capsule Take 25 mg by mouth 3 (three) times daily.  ?  metoprolol succinate (TOPROL-XL) 50 MG 24 hr tablet Take 75 mg by mouth daily. Take with or immediately following a meal.  ? nitroGLYCERIN (NITROSTAT) 0.4 MG SL tablet Place 0.4 mg under the tongue every 5 (five) minutes as needed for chest pain.  ? olmesartan (BENICAR) 20 MG tablet Take 20 mg by mouth daily.  ? Omega-3 Fatty Acids (OMEGA 3 PO) Take 950 mg by mouth 2 (two) times daily.  ? rosuvastatin (CRESTOR) 40 MG tablet Take 40 mg by mouth every evening.   ? Saw Palmetto, Serenoa repens, (SAW PALMETTO PO) Take 1 capsule by mouth daily.   ? sertraline (ZOLOFT) 50 MG tablet Take 50  mg by mouth daily.  ? tamsulosin (FLOMAX) 0.4 MG CAPS capsule Take 0.8 mg by mouth every evening.   ? tiZANidine (ZANAFLEX) 4 MG tablet Take 4 mg by mouth daily as needed (spasms (in the evening)).   ? Ubiquinone (ULTRA COQ10 PO) Take 1 capsule by mouth daily.  ?  ? ?No Known Allergies ? ?Social History  ? ?Socioeconomic History  ? Marital status: Married  ?  Spouse name: Not on file  ? Number of children: Not on file  ? Years of education: Not on file  ? Highest education level: Not on file  ?Occupational History  ? Not on file  ?Tobacco Use  ? Smoking status: Former  ?  Types: Cigarettes  ?  Quit date: 02/01/2003  ?  Years since quitting: 18.2  ? Smokeless tobacco: Former  ?  Types: Chew  ?Substance and Sexual Activity  ? Alcohol use: Yes  ?  Alcohol/week: 1.0 standard drink  ?  Types: 1 Cans of beer per week  ? Drug use: Not on file  ? Sexual activity: Not on file  ?Other Topics Concern  ? Not on file  ?Social History Narrative  ? Not on file  ? ?Social Determinants of Health  ? ?Financial Resource Strain: Not on file  ?Food Insecurity: Not on file  ?Transportation Needs: Not on file  ?Physical Activity: Not on file  ?Stress: Not on file  ?Social Connections: Not on file  ?Intimate Partner Violence: Not on file  ?  ? ?Review of Systems: ?General: negative for chills, fever, night sweats or weight changes.  ?Cardiovascular: negative for chest pain, dyspnea on exertion, edema, orthopnea, palpitations, paroxysmal nocturnal dyspnea or shortness of breath ?Dermatological: negative for rash ?Respiratory: negative for cough or wheezing ?Urologic: negative for hematuria ?Abdominal: negative for nausea, vomiting, diarrhea, bright red blood per rectum, melena, or hematemesis ?Neurologic: negative for visual changes, syncope, or dizziness ?All other systems reviewed and are otherwise negative except as noted above. ? ? ? ?Blood pressure 122/76, pulse 61, height '5\' 7"'$  (1.702 m), weight 194 lb 9.6 oz (88.3 kg), SpO2 98 %.   ?General appearance: alert and no distress ?Neck: no adenopathy, no carotid bruit, no JVD, supple, symmetrical, trachea midline, and thyroid not enlarged, symmetric, no tenderness/mass/nodules ?Lungs: clear to auscultation bilaterally ?Heart: regular rate and rhythm, S1, S2 normal, no murmur, click, rub or gallop ?Extremities: extremities normal, atraumatic, no cyanosis or edema ?Pulses: 2+ and symmetric ?Skin: Skin color, texture, turgor normal. No rashes or lesions ?Neurologic: Grossly normal ? ?EKG sinus rhythm at 61 without ST or T wave changes.  Personally reviewed this EKG. ? ?ASSESSMENT AND PLAN:  ? ?CAD CABG X 5 '05. Low risk Myoview 12/12 ?History of coronary artery bypass grafting by Dr. Dahlia Byes in February 2005 with a LIMA to the LAD,  vein to a PDA, OM1 and OM 2 sequentially.  He had a Myoview performed in 2012 that was nonischemic.  He denies chest pain or shortness of breath. ? ?Mild carotid artery disease ([place]) ?History of moderate bilateral ICA stenosis by duplex ultrasound recently performed 11/03/2020.  This will be repeated on an annual basis. ? ?Hyperlipidemia LDL goal <70 ?History of hyperlipidemia on statin therapy with lipid profile performed 03/04/2021 revealing total cholesterol 130, LDL 59 and HDL 48. ? ?Claudication in peripheral vascular disease ([place]) ?History of peripheral arterial disease status post left iliac stenting by myself 11/05/2018 with an 8 mm x 59 mm long VBX covered stent.  He did have a 60% ostial right common iliac artery stenosis and high-grade 95% calcified bilateral SFA disease.  He also has an occluded left popliteal artery with one-vessel runoff via the anterior tibial.  I brought him back on 12/10/2018 for staged intervention of his left SFA.  I performed diamondback orbital rotational atherectomy, drug-coated balloon angioplasty with nitinol self-expanding stenting with excellent result.  He still had a residual occluded popliteal artery with a large bridging  collateral.  His Dopplers improved as did his claudication.  He currently denies claudication in either leg. ? ? ? ? ?Lorretta Harp MD FACP,FACC,FAHA, FSCAI ?04/22/2021 ?4:21 PM ?

## 2021-04-22 NOTE — Patient Instructions (Addendum)
Medication Instructions:  ?Your physician recommends that you continue on your current medications as directed. Please refer to the Current Medication list given to you today. ? ?*If you need a refill on your cardiac medications before your next appointment, please call your pharmacy* ? ? ?Testing/Procedures: ?Dr. [name] has recommended that you have an Ultrasound of your AORTA/IVC/ILIACS.  ? ?To prepare for this test: ? ?No food after 11PM the night before. Water is OK. (Don't drink liquids if you have been instructed not to for ANOTHER test).  ?Avoid foods that produce bowel gas, for 24 hours prior to exam (see below). ?No breakfast, no chewing gum, no smoking or carbonated beverages. ?Patient may take morning medications with water. ?Come in for test at least 15 minutes early to register. ? ?Your physician has requested that you have a lower extremity arterial duplex. This test is an ultrasound of the arteries in the legs. It looks at arterial blood flow in the legs. Allow one hour for Lower Arterial scans. There are no restrictions or special instructions ? ?Your physician has requested that you have an ankle brachial index (ABI). During this test an ultrasound and blood pressure cuff are used to evaluate the arteries that supply the arms and legs with blood. Allow thirty minutes for this exam. There are no restrictions or special instructions. To be done in May. ? ? ?Your physician has requested that you have a carotid duplex. This test is an ultrasound of the carotid arteries in your neck. It looks at blood flow through these arteries that supply the brain with blood. Allow one hour for this exam. There are no restrictions or special instructions. To do in October.  ? ?These procedures will be done at [place] ? ? ?Follow-Up: ?At [facility], you and your health needs are our priority.  As part of our continuing mission to provide you with exceptional heart care, we have created designated  Provider Care Teams.  These Care Teams include your primary Cardiologist (physician) and Advanced Practice Providers (APPs -  Physician Assistants and Nurse Practitioners) who all work together to provide you with the care you need, when you need it. ? ?We recommend signing up for the patient portal called "MyChart".  Sign up information is provided on this After Visit Summary.  MyChart is used to connect with patients for Virtual Visits (Telemedicine).  Patients are able to view lab/test results, encounter notes, upcoming appointments, etc.  Non-urgent messages can be sent to your provider as well.   ?To learn more about what you can do with MyChart, go to [email or web address].   ? ?Your next appointment:   ?12 month(s) ? ?The format for your next appointment:   ?In Person ? ?Provider:   ?[name], MD  ?

## 2021-06-07 ENCOUNTER — Encounter (HOSPITAL_COMMUNITY): Payer: BC Managed Care – PPO

## 2021-06-09 DIAGNOSIS — G4733 Obstructive sleep apnea (adult) (pediatric): Secondary | ICD-10-CM | POA: Diagnosis not present

## 2021-06-10 ENCOUNTER — Inpatient Hospital Stay (HOSPITAL_COMMUNITY): Admission: RE | Admit: 2021-06-10 | Payer: BC Managed Care – PPO | Source: Ambulatory Visit

## 2021-06-10 ENCOUNTER — Encounter (HOSPITAL_COMMUNITY): Payer: BC Managed Care – PPO

## 2021-06-30 ENCOUNTER — Other Ambulatory Visit (HOSPITAL_COMMUNITY): Payer: Self-pay | Admitting: Cardiovascular Disease

## 2021-06-30 DIAGNOSIS — I739 Peripheral vascular disease, unspecified: Secondary | ICD-10-CM

## 2021-06-30 DIAGNOSIS — Z95828 Presence of other vascular implants and grafts: Secondary | ICD-10-CM

## 2021-07-07 ENCOUNTER — Encounter (HOSPITAL_COMMUNITY): Payer: BC Managed Care – PPO

## 2021-07-07 ENCOUNTER — Ambulatory Visit (HOSPITAL_BASED_OUTPATIENT_CLINIC_OR_DEPARTMENT_OTHER)
Admission: RE | Admit: 2021-07-07 | Discharge: 2021-07-07 | Disposition: A | Discharge status: Home / Self Care | Payer: BC Managed Care – PPO | Source: Ambulatory Visit | Attending: Cardiovascular Disease | Admitting: Cardiovascular Disease

## 2021-07-07 ENCOUNTER — Ambulatory Visit (HOSPITAL_COMMUNITY)
Admission: RE | Admit: 2021-07-07 | Discharge: 2021-07-07 | Disposition: A | Discharge status: Home / Self Care | Payer: BC Managed Care – PPO | Source: Ambulatory Visit | Attending: Cardiovascular Disease | Admitting: Cardiovascular Disease

## 2021-07-07 DIAGNOSIS — Z95828 Presence of other vascular implants and grafts: Secondary | ICD-10-CM | POA: Insufficient documentation

## 2021-07-07 DIAGNOSIS — I739 Peripheral vascular disease, unspecified: Secondary | ICD-10-CM

## 2021-09-14 DIAGNOSIS — N3091 Cystitis, unspecified with hematuria: Secondary | ICD-10-CM | POA: Diagnosis not present

## 2021-09-14 DIAGNOSIS — N3001 Acute cystitis with hematuria: Secondary | ICD-10-CM | POA: Diagnosis not present

## 2021-09-17 DIAGNOSIS — I10 Essential (primary) hypertension: Secondary | ICD-10-CM | POA: Diagnosis not present

## 2021-09-17 DIAGNOSIS — E785 Hyperlipidemia, unspecified: Secondary | ICD-10-CM | POA: Diagnosis not present

## 2021-09-23 DIAGNOSIS — N400 Enlarged prostate without lower urinary tract symptoms: Secondary | ICD-10-CM | POA: Diagnosis not present

## 2021-09-23 DIAGNOSIS — E785 Hyperlipidemia, unspecified: Secondary | ICD-10-CM | POA: Diagnosis not present

## 2021-09-23 DIAGNOSIS — I2510 Atherosclerotic heart disease of native coronary artery without angina pectoris: Secondary | ICD-10-CM | POA: Diagnosis not present

## 2021-09-23 DIAGNOSIS — I10 Essential (primary) hypertension: Secondary | ICD-10-CM | POA: Diagnosis not present

## 2021-09-23 DIAGNOSIS — Z6829 Body mass index (BMI) 29.0-29.9, adult: Secondary | ICD-10-CM | POA: Diagnosis not present

## 2021-10-01 DIAGNOSIS — G4733 Obstructive sleep apnea (adult) (pediatric): Secondary | ICD-10-CM | POA: Diagnosis not present

## 2021-10-19 ENCOUNTER — Encounter: Payer: Self-pay | Admitting: Gastroenterology

## 2021-11-08 ENCOUNTER — Ambulatory Visit (HOSPITAL_COMMUNITY)
Admission: RE | Admit: 2021-11-08 | Discharge: 2021-11-08 | Disposition: A | Discharge status: Home / Self Care | Payer: BC Managed Care – PPO | Source: Ambulatory Visit | Attending: Internal Medicine | Admitting: Internal Medicine

## 2021-11-08 DIAGNOSIS — I6523 Occlusion and stenosis of bilateral carotid arteries: Secondary | ICD-10-CM | POA: Insufficient documentation

## 2021-12-16 ENCOUNTER — Telehealth: Payer: Self-pay

## 2021-12-16 ENCOUNTER — Encounter: Payer: Self-pay | Admitting: Gastroenterology

## 2021-12-16 ENCOUNTER — Ambulatory Visit: Payer: BC Managed Care – PPO | Admitting: Gastroenterology

## 2021-12-16 VITALS — BP 130/80 | HR 70 | Ht 67.0 in | Wt 196.6 lb

## 2021-12-16 DIAGNOSIS — Z1211 Encounter for screening for malignant neoplasm of colon: Secondary | ICD-10-CM | POA: Diagnosis not present

## 2021-12-16 DIAGNOSIS — Z1212 Encounter for screening for malignant neoplasm of rectum: Secondary | ICD-10-CM

## 2021-12-16 NOTE — Progress Notes (Signed)
Chief Complaint: For screening colonoscopy  Referring Provider:  Jacelyn Pi, Irma M, *      ASSESSMENT AND PLAN;   #1. CRC screening  #2. PAD on plavix/bASA   Plan: -Colon after holding plavix x 5 days (cardiac clearence Dr [name]) with 2 day prep.  Jan 2024 - any Friday/or Monday will do.    Discussed risks & benefits of colonoscopy. Risks including rare perforation req laparotomy, bleeding after bx/polypectomy req blood transfusion, rarely missing neoplasms, risks of anesthesia/sedation, rare risk of damage to internal organs. Benefits outweigh the risks. Patient agrees to proceed. All the questions were answered. Pt consents to proceed.   HPI:    Jorge Jackson is a 66 y.o. male  CAD s/p CABD (Nl EF), PAD on plavix/bASA, OSA on CPAP, HTN, anxiety/depression, erectile dysfunction.  Here for screening colonoscopy  No nausea, vomiting, heartburn, regurgitation, odynophagia or dysphagia.  No significant diarrhea or constipation.  No melena or hematochezia. No unintentional weight loss. No abdominal pain.  Has Hoids-do act up when he is constipated  Previous GI work-up: Colonoscopy 04/07/2010 ([facility]): Mild sigmoid diverticulosis.  Small internal hemorrhoids.  Otherwise normal colonoscopy.  Limited due to prep.  SH- Daughter and Karna Dupes in [employer]/going to [facility] Past Medical History:  Diagnosis Date   Anxiety and depression    CAD (coronary artery disease) 03/2003   hx CABG X 5    Erectile dysfunction    H/O cardiovascular stress test 2012   EF 76%, normal perfusion   H/O echocardiogram 02/25/2009   EF >55%, suggestion of impaired LV relaxtion, tr MR   Hx of myocardial infarction 03/2003   cath-CABG   Hyperlipidemia LDL goal <70    Mild carotid artery disease ([place]) 08/12/2011   Lt carotid by ultrasound, 50-69%, rt 0-49%   PVD (peripheral vascular disease) ([place])     Past Surgical History:  Procedure Laterality Date   ABDOMINAL AORTOGRAM W/LOWER  EXTREMITY Bilateral 11/05/2018   Procedure: ABDOMINAL AORTOGRAM W/LOWER EXTREMITY;  Surgeon: Lorretta Harp, MD;  Location: [place] [facility];  Service: Cardiovascular;  Laterality: Bilateral;   CARDIAC CATHETERIZATION     COLONOSCOPY  04/07/2010   Mild sigmoid diverticulosos. Small internal hemorrhoids. Otherwise grossly normal colonsocopy   CORONARY ARTERY BYPASS GRAFT  03/05/2003   LIMA-LAD; VG-PDA; VG-OM1; sequential VG-OM 2 and OM 3   LOWER EXTREMITY ANGIOGRAPHY Left 12/10/2018   Procedure: LOWER EXTREMITY ANGIOGRAPHY;  Surgeon: Lorretta Harp, MD;  Location: [place] [facility];  Service: Cardiovascular;  Laterality: Left;   PERIPHERAL VASCULAR ATHERECTOMY  11/05/2018   Procedure: PERIPHERAL VASCULAR ATHERECTOMY;  Surgeon: Lorretta Harp, MD;  Location: [place] [facility];  Service: Cardiovascular;;   PERIPHERAL VASCULAR ATHERECTOMY Left 12/10/2018   Procedure: PERIPHERAL VASCULAR ATHERECTOMY;  Surgeon: Lorretta Harp, MD;  Location: [place] [facility];  Service: Cardiovascular;  Laterality: Left;  Left SFA   PERIPHERAL VASCULAR INTERVENTION  11/05/2018   Procedure: PERIPHERAL VASCULAR INTERVENTION;  Surgeon: Lorretta Harp, MD;  Location: [place] [facility];  Service: Cardiovascular;;   PERIPHERAL VASCULAR INTERVENTION Left 12/10/2018   Procedure: PERIPHERAL VASCULAR INTERVENTION;  Surgeon: Lorretta Harp, MD;  Location: [place] [facility];  Service: Cardiovascular;  Laterality: Left;  left SFA    Family History  Problem Relation Age of Onset   Stroke Mother 14   Heart disease Mother    Sudden death Father     Social History   Tobacco Use   Smoking status: Former  Types: Cigarettes    Quit date: 02/01/2003    Years since quitting: 18.8   Smokeless tobacco: Former    Types: Chew  Substance Use Topics   Alcohol use: Yes    Alcohol/week: 1.0 standard drink of alcohol    Types: 1 Cans of beer per week    Current Outpatient Medications  Medication Sig  Dispense Refill   ALPRAZolam (XANAX) 1 MG tablet Take 1 tablet by mouth at bedtime. (Patient not taking: Reported on 04/22/2021)     aspirin EC 81 MG tablet Take 81 mg by mouth daily.     CIALIS 20 MG tablet Take 20 mg by mouth daily as needed for erectile dysfunction.      clopidogrel (PLAVIX) 75 MG tablet Take 75 mg by mouth daily with breakfast.     co-enzyme Q-10 30 MG capsule Take by mouth.     hydrOXYzine (VISTARIL) 25 MG capsule Take 25 mg by mouth 3 (three) times daily. (Patient not taking: Reported on 12/16/2021)     metoprolol succinate (TOPROL-XL) 50 MG 24 hr tablet Take 75 mg by mouth daily. Take with or immediately following a meal.     nitroGLYCERIN (NITROSTAT) 0.4 MG SL tablet Place 0.4 mg under the tongue every 5 (five) minutes as needed for chest pain.     olmesartan (BENICAR) 20 MG tablet Take 20 mg by mouth daily.     Omega-3 Fatty Acids (OMEGA 3 PO) Take 950 mg by mouth 2 (two) times daily.     rosuvastatin (CRESTOR) 40 MG tablet Take 40 mg by mouth every evening.      Saw Palmetto, Serenoa repens, (SAW PALMETTO PO) Take 1 capsule by mouth daily.      sertraline (ZOLOFT) 50 MG tablet Take 50 mg by mouth daily.     tamsulosin (FLOMAX) 0.4 MG CAPS capsule Take 0.8 mg by mouth every evening.      tiZANidine (ZANAFLEX) 4 MG tablet Take 4 mg by mouth daily as needed (spasms (in the evening)).      traMADol (ULTRAM) 50 MG tablet Take 50 mg by mouth 5 (five) times daily as needed (pain.).  (Patient not taking: Reported on 04/22/2021)     Ubiquinone (ULTRA COQ10 PO) Take 1 capsule by mouth daily.     No current facility-administered medications for this visit.    No Known Allergies  Review of Systems:  Constitutional: Denies fever, chills, diaphoresis, appetite change and fatigue.  HEENT: Denies photophobia, eye pain, redness, hearing loss, ear pain, congestion, sore throat, rhinorrhea, sneezing, mouth sores, neck pain, neck stiffness and tinnitus.   Respiratory: Denies SOB, DOE,  cough, chest tightness,  and wheezing.   Cardiovascular: Denies chest pain, palpitations and leg swelling.  Genitourinary: Denies dysuria, urgency, frequency, hematuria, flank pain and difficulty urinating.  Musculoskeletal: Denies myalgias, has back pain, joint swelling, arthralgias and had gait problem.  Skin: No rash.  Neurological: Denies dizziness, seizures, syncope, weakness, light-headedness, numbness and headaches.  Hematological: Denies adenopathy. Easy bruising, personal or family bleeding history  Psychiatric/Behavioral: Has anxiety or depression     Physical Exam:    BP 130/80   Pulse 70   Ht '5\' 7"'$  (1.702 m)   Wt 196 lb 9.6 oz (89.2 kg)   SpO2 96%   BMI 30.79 kg/m  Wt Readings from Last 3 Encounters:  12/16/21 196 lb 9.6 oz (89.2 kg)  04/22/21 194 lb 9.6 oz (88.3 kg)  06/25/19 192 lb (87.1 kg)   Constitutional:  Well-developed, in  no acute distress. Psychiatric: Normal mood and affect. Behavior is normal. HEENT: Conjunctivae are normal. No scleral icterus.  Cardiovascular: Normal rate, regular rhythm. No edema Pulmonary/chest: Effort normal and breath sounds normal. No wheezing, rales or rhonchi. Abdominal: Soft, nondistended. Nontender. Bowel sounds active throughout. There are no masses palpable. No hepatomegaly. Rectal: Deferred Neurological: Alert and oriented to person place and time. Skin: Skin is warm and dry. No rashes noted.    Carmell Austria, MD 12/16/2021, 10:29 AM  Cc: Jacelyn Pi, Lilia Argue, *

## 2021-12-16 NOTE — Telephone Encounter (Signed)
[facility] Pre-operative Risk Assessment     Request for surgical clearance:     Endoscopy Procedure  What type of surgery is being performed?     Colonoscopy  When is this surgery scheduled?     02-25-2022  What type of clearance is required ?   Pharmacy  Are there any medications that need to be held prior to surgery and how long? Plavix 5 day hold  Practice name and name of physician performing surgery?      [facility]  What is your office phone and fax number?      Phone- [phone]  Fax843 [phone]  Anesthesia type (None, local, MAC, general) ?       MAC

## 2021-12-16 NOTE — Patient Instructions (Addendum)
_______________________________________________________  If you are age 64 or older, your body mass index should be between 23-30. Your Body mass index is 30.79 kg/m. If this is out of the aforementioned range listed, please consider follow up with your Primary Care Provider.  If you are age 41 or younger, your body mass index should be between 19-25. Your Body mass index is 30.79 kg/m. If this is out of the aformentioned range listed, please consider follow up with your Primary Care Provider.   ________________________________________________________  The [facility] providers would like to encourage you to use [facility] to communicate with providers for non-urgent requests or questions.  Due to long hold times on the telephone, sending your provider a message by [facility] may be a faster and more efficient way to get a response.  Please allow 48 business hours for a response.  Please remember that this is for non-urgent requests.  _______________________________________________________  Jorge Jackson have been scheduled for a colonoscopy. Please follow written instructions given to you at your visit today.  Please pick up your prep supplies at the pharmacy within the next 1-3 days. If you use inhalers (even only as needed), please bring them with you on the day of your procedure.  You will be contacted by our office prior to your procedure for directions on holding your Plavix.  If you do not hear from our office 2 week prior to your scheduled procedure, please call [phone] to discuss.    Two days before your procedure: Mix 3 packs (or capfuls) of Miralax in 48 ounces of clear liquid and drink at 6pm.  Thank you,  Dr. Jackquline Denmark

## 2021-12-17 NOTE — Telephone Encounter (Signed)
   Patient Name: Jorge Jackson  DOB: 19-Mar-1955 MRN: [id number]  Primary Cardiologist: [name], MD  Chart reviewed as part of pre-operative protocol coverage. Given past medical history and time since last visit, based on ACC/AHA guidelines, Macoy Rodwell is at acceptable risk for the planned procedure without further cardiovascular testing.   Per Dr. [name], primary cardiologist, pt may hold Plavix for 5 days prior to procedure. Please resume Plavix as soon as possible postprocedure, at the discretion of the surgeon.   I will route this recommendation to the requesting party via Epic fax function and remove from pre-op pool.  Please call with questions.  Lenna Sciara, NP 12/17/2021, 12:16 PM

## 2021-12-17 NOTE — Telephone Encounter (Signed)
Patient made aware and voiced understanding.

## 2021-12-27 ENCOUNTER — Other Ambulatory Visit (HOSPITAL_COMMUNITY): Payer: Self-pay | Admitting: Cardiovascular Disease

## 2021-12-27 DIAGNOSIS — I6523 Occlusion and stenosis of bilateral carotid arteries: Secondary | ICD-10-CM

## 2022-01-25 DIAGNOSIS — U071 COVID-19: Secondary | ICD-10-CM | POA: Diagnosis not present

## 2022-02-18 ENCOUNTER — Encounter: Payer: Self-pay | Admitting: Gastroenterology

## 2022-02-20 ENCOUNTER — Encounter: Payer: Self-pay | Admitting: Certified Registered Nurse Anesthetist

## 2022-02-25 ENCOUNTER — Encounter: Payer: BC Managed Care – PPO | Admitting: Gastroenterology

## 2022-02-25 ENCOUNTER — Telehealth: Payer: Self-pay

## 2022-02-25 NOTE — Telephone Encounter (Signed)
Pt called on-call MD. Stated that he "drank the gatorade, but forgot to add the miralax to the mix". On-call MD advised patient that he will probably have to be rescheduled if MD does not have any openings for later in the day. Dr. Lyndel Safe is fully booked for today. Pt will need to be rescheduled, and given new cardiac clearance as he is on PLAVIX.

## 2022-02-25 NOTE — Telephone Encounter (Signed)
No cancellation fee Thanks for rescheduling RG

## 2022-02-25 NOTE — Telephone Encounter (Signed)
Patient called to reschedule his procedure, he was rescheduled for 3/4 at 4:00 PM. Office visit with Ellouise Newer also scheduled for 2/19 to discuss Plavix and colonoscopy. Patient stated he has been very stressed and made a mistake and forgot to mix the Miralax into the Gatorade. Patient is requesting a one time fee waive for this procedure.

## 2022-03-21 ENCOUNTER — Encounter: Payer: Self-pay | Admitting: Physician Assistant

## 2022-03-21 ENCOUNTER — Telehealth: Payer: Self-pay | Admitting: *Deleted

## 2022-03-21 ENCOUNTER — Ambulatory Visit: Payer: BC Managed Care – PPO | Admitting: Physician Assistant

## 2022-03-21 VITALS — BP 126/80 | HR 72 | Ht 67.0 in | Wt 198.4 lb

## 2022-03-21 DIAGNOSIS — Z1212 Encounter for screening for malignant neoplasm of rectum: Secondary | ICD-10-CM | POA: Diagnosis not present

## 2022-03-21 DIAGNOSIS — Z7901 Long term (current) use of anticoagulants: Secondary | ICD-10-CM

## 2022-03-21 DIAGNOSIS — Z1211 Encounter for screening for malignant neoplasm of colon: Secondary | ICD-10-CM | POA: Diagnosis not present

## 2022-03-21 NOTE — Progress Notes (Signed)
Chief Complaint: Discuss screening colonoscopy on chronic anticoagulation  HPI:    Jorge Jackson is a 67 year old male with a past medical history of CAD status post CABG (NI EF), PAD on Plavix and Aspirin, OSA on CPAP, hypertension, anxiety, depression and erectile dysfunction, who is known to Dr. Lyndel Safe and returns to clinic today to discuss a screening colonoscopy on chronic anticoagulation.    04/07/2010 colonoscopy with mild sigmoid diverticulosis, small internal hemorrhoids and otherwise normal.  Limited due to prep.    12/16/2021 patient saw Dr. Lyndel Safe and discussed colorectal cancer screening.  It was recommended that he hold his Plavix for 5 days with a 2-day bowel prep.    02/25/2022 patient called and he mistakenly not used his MiraLAX per recommendations.  His colonoscopy was canceled and he was made a repeat office visit.    Today, the patient tells me nothing is changed since his first visit with Dr. Lyndel Safe.  He just accidentally forgot to use MiraLAX when he was using his bowel prep.  He is ready to proceed as scheduled on March 4.    Denies fever, chills, change in bowel habits, abdominal pain, heartburn or reflux.  Past Medical History:  Diagnosis Date   Anxiety and depression    CAD (coronary artery disease) 03/2003   hx CABG X 5    Erectile dysfunction    H/O cardiovascular stress test 2012   EF 76%, normal perfusion   H/O echocardiogram 02/25/2009   EF >55%, suggestion of impaired LV relaxtion, tr MR   Hx of myocardial infarction 03/2003   cath-CABG   Hyperlipidemia LDL goal <70    Mild carotid artery disease ([place]) 08/12/2011   Lt carotid by ultrasound, 50-69%, rt 0-49%   PVD (peripheral vascular disease) ([place])     Past Surgical History:  Procedure Laterality Date   ABDOMINAL AORTOGRAM W/LOWER EXTREMITY Bilateral 11/05/2018   Procedure: ABDOMINAL AORTOGRAM W/LOWER EXTREMITY;  Surgeon: Lorretta Harp, MD;  Location: [facility];  Service: Cardiovascular;   Laterality: Bilateral;   CARDIAC CATHETERIZATION     COLONOSCOPY  04/07/2010   Mild sigmoid diverticulosos. Small internal hemorrhoids. Otherwise grossly normal colonsocopy   CORONARY ARTERY BYPASS GRAFT  03/05/2003   LIMA-LAD; VG-PDA; VG-OM1; sequential VG-OM 2 and OM 3   LOWER EXTREMITY ANGIOGRAPHY Left 12/10/2018   Procedure: LOWER EXTREMITY ANGIOGRAPHY;  Surgeon: Lorretta Harp, MD;  Location: [facility];  Service: Cardiovascular;  Laterality: Left;   PERIPHERAL VASCULAR ATHERECTOMY  11/05/2018   Procedure: PERIPHERAL VASCULAR ATHERECTOMY;  Surgeon: Lorretta Harp, MD;  Location: [facility];  Service: Cardiovascular;;   PERIPHERAL VASCULAR ATHERECTOMY Left 12/10/2018   Procedure: PERIPHERAL VASCULAR ATHERECTOMY;  Surgeon: Lorretta Harp, MD;  Location: [facility];  Service: Cardiovascular;  Laterality: Left;  Left SFA   PERIPHERAL VASCULAR INTERVENTION  11/05/2018   Procedure: PERIPHERAL VASCULAR INTERVENTION;  Surgeon: Lorretta Harp, MD;  Location: [facility];  Service: Cardiovascular;;   PERIPHERAL VASCULAR INTERVENTION Left 12/10/2018   Procedure: PERIPHERAL VASCULAR INTERVENTION;  Surgeon: Lorretta Harp, MD;  Location: [facility];  Service: Cardiovascular;  Laterality: Left;  left SFA    Current Outpatient Medications  Medication Sig Dispense Refill   aspirin EC 81 MG tablet Take 81 mg by mouth daily.     CIALIS 20 MG tablet Take 20 mg by mouth daily as needed for erectile dysfunction.      clopidogrel (PLAVIX) 75 MG tablet Take  75 mg by mouth daily with breakfast.     co-enzyme Q-10 30 MG capsule Take by mouth.     metoprolol succinate (TOPROL-XL) 50 MG 24 hr tablet Take 75 mg by mouth daily. Take with or immediately following a meal.     nitroGLYCERIN (NITROSTAT) 0.4 MG SL tablet Place 0.4 mg under the tongue every 5 (five) minutes as needed for chest pain.     olmesartan (BENICAR) 20 MG tablet Take 20 mg by mouth daily.      Omega-3 Fatty Acids (OMEGA 3 PO) Take 950 mg by mouth 2 (two) times daily.     rosuvastatin (CRESTOR) 40 MG tablet Take 40 mg by mouth every evening.      Saw Palmetto, Serenoa repens, (SAW PALMETTO PO) Take 1 capsule by mouth daily.      sertraline (ZOLOFT) 50 MG tablet Take 50 mg by mouth daily.     tamsulosin (FLOMAX) 0.4 MG CAPS capsule Take 0.8 mg by mouth every evening.      Ubiquinone (ULTRA COQ10 PO) Take 1 capsule by mouth daily.     traMADol (ULTRAM) 50 MG tablet Take 50 mg by mouth 5 (five) times daily as needed (pain.). (Patient not taking: Reported on 03/21/2022)     No current facility-administered medications for this visit.    Allergies as of 03/21/2022   (No Known Allergies)    Family History  Problem Relation Age of Onset   Stroke Mother 74   Heart disease Mother    Sudden death Father    Colon cancer Neg Hx    Stomach cancer Neg Hx    Esophageal cancer Neg Hx     Social History   Socioeconomic History   Marital status: Married    Spouse name: Not on file   Number of children: Not on file   Years of education: Not on file   Highest education level: Not on file  Occupational History   Not on file  Tobacco Use   Smoking status: Former    Types: Cigarettes    Quit date: 02/01/2003    Years since quitting: 19.1   Smokeless tobacco: Former    Types: Chew  Substance and Sexual Activity   Alcohol use: Yes    Alcohol/week: 1.0 standard drink of alcohol    Types: 1 Cans of beer per week   Drug use: Not on file   Sexual activity: Not on file  Other Topics Concern   Not on file  Social History Narrative   Not on file   Social Determinants of Health   Financial Resource Strain: Not on file  Food Insecurity: Not on file  Transportation Needs: Not on file  Physical Activity: Not on file  Stress: Not on file  Social Connections: Unknown (11/05/2018)   Social Connection and Isolation Panel [NHANES]    Frequency of Communication with Friends and Family:  More than three times a week    Frequency of Social Gatherings with Friends and Family: Not on file    Attends Religious Services: Not on file    Active Member of Clubs or Organizations: Not on file    Attends Archivist Meetings: Not on file    Marital Status: Not on file  Intimate Partner Violence: Not At Risk (11/05/2018)   Humiliation, Afraid, Rape, and Kick questionnaire    Fear of Current or Ex-Partner: No    Emotionally Abused: No    Physically Abused: No    Sexually Abused: No  Review of Systems:    Constitutional: No weight loss, fever or chills Cardiovascular: No chest pain Respiratory: No SOB  Gastrointestinal: See HPI and otherwise negative    Physical Exam:  Vital signs: BP 126/80   Pulse 72   Ht 5' 7"$  (1.702 m)   Wt 198 lb 6.4 oz (90 kg)   SpO2 97%   BMI 31.07 kg/m   Constitutional:   Pleasant Caucasian male appears to be in NAD, Well developed, Well nourished, alert and cooperative Respiratory: Respirations even and unlabored. Lungs clear to auscultation bilaterally.   No wheezes, crackles, or rhonchi.  Cardiovascular: Normal S1, S2. No MRG. Regular rate and rhythm. No peripheral edema, cyanosis or pallor.  Gastrointestinal:  Soft, nondistended, nontender. No rebound or guarding. Normal bowel sounds. No appreciable masses or hepatomegaly. Rectal:  Not performed.  Psychiatric: Oriented to person, place and time. Demonstrates good judgement and reason without abnormal affect or behaviors.  No recent labs or imaging.  Assessment: 1.  Screening for colorectal cancer: Last colonoscopy greater than 10 years ago 2.  Chronic anticoagulation: On Plavix  Plan: 1.  Patient is already scheduled for a screening colonoscopy on 04/04/2022 with Dr. Lyndel Safe.  Did provide the patient a detailed list risks for the procedure and he agrees to proceed. Patient is appropriate for endoscopic procedure(s) in the ambulatory ([place]) setting.  2.  Patient advised to hold his  Plavix for 5 days prior to time of procedure.  We will communicate this with his prescribing physician to ensure this is acceptable to him. 3.  As previous discussed patient needs a 2-day bowel prep. 4.  Patient to follow in clinic per recommendations after time of procedure.  Jorge Newer, PA-C [facility] 03/21/2022, 2:54 PM  Cc: Jacelyn Pi, Lilia Argue, *

## 2022-03-21 NOTE — Telephone Encounter (Signed)
Primary Cardiologist:Jonathan [name], MD  Chart reviewed as part of pre-operative protocol coverage. Because of Doss Mccormack past medical history and time since last visit, he/she will require a follow-up visit in order to better assess preoperative cardiovascular risk.  Pre-op covering staff: - Patient is due for his annual appointment 04/2022. Procedure is scheduled for 04/04/22. Per his preference, we can schedule an office visit with Dr. [name] or an APP or if appointment is past procedure date, we can schedule a virtual visit to get him cleared in time for procedure. - Please contact requesting surgeon's office via preferred method (i.e, phone, fax) to inform them of need for appointment prior to surgery.  Per office protocol, he may hold Plavix for 5 days prior to procedure pending no s/s of ACS.   Emmaline Life, NP-C  03/21/2022, 3:52 PM [address], [address] Office [phone] Fax [phone]

## 2022-03-21 NOTE — Telephone Encounter (Signed)
[facility] Pre-operative Risk Assessment     Request for surgical clearance:     Endoscopy Procedure  What type of surgery is being performed?     colonoscopy  When is this surgery scheduled?     04/04/22  What type of clearance is required ?   Pharmacy  Are there any medications that need to be held prior to surgery and how long? Plavix 5 days  Practice name and name of physician performing surgery?      [facility]  What is your office phone and fax number?      Phone- [phone]  Fax725 [phone]  Anesthesia type (None, local, MAC, general) ?       MAC

## 2022-03-21 NOTE — Telephone Encounter (Signed)
S/w pt is scheduled for appt with Dr. [name] tomorrow @ 2:00.  If pt cannot keep appt will call to R/S.

## 2022-03-22 ENCOUNTER — Ambulatory Visit: Payer: BC Managed Care – PPO | Attending: Cardiovascular Disease | Admitting: Cardiovascular Disease

## 2022-03-22 ENCOUNTER — Encounter: Payer: Self-pay | Admitting: Cardiovascular Disease

## 2022-03-22 VITALS — BP 130/86 | HR 70 | Ht 67.0 in | Wt 194.6 lb

## 2022-03-22 DIAGNOSIS — I2510 Atherosclerotic heart disease of native coronary artery without angina pectoris: Secondary | ICD-10-CM

## 2022-03-22 DIAGNOSIS — I739 Peripheral vascular disease, unspecified: Secondary | ICD-10-CM

## 2022-03-22 DIAGNOSIS — I779 Disorder of arteries and arterioles, unspecified: Secondary | ICD-10-CM

## 2022-03-22 DIAGNOSIS — E785 Hyperlipidemia, unspecified: Secondary | ICD-10-CM

## 2022-03-22 NOTE — Progress Notes (Signed)
03/22/2022 Jorge Jackson   1955-03-22  [id number]  Primary Physician Jacelyn Pi, Lilia Argue, MD Primary Cardiologist: Lorretta Harp MD [name], [place], [place]  HPI:  Jorge Jackson is a 67 y.o.  mildly overweight married Caucasian male father of 60, grandfather to 1 grandson who I last saw in the office 04/22/2021... He lost his job at the end of 2011. He worked at the [employer] in [place] which was outsourced to [place]. He was then hired by a [place] in March of 2017 in [place] and now works at [employer] bus.. He had coronary artery bypass grafting in February of 2005 by Dr. Tharon Aquas Trigt with a LIMA to his LAD, a vein to a PDA, OM1 and OM2 sequentially. He is totally asymptomatic. He has mild left internal carotid artery stenosis which we follow by duplex ultrasound. Dr. Micheal Likens follows his lipid profile. His last Myoview performed January 20, 2011 was nonischemic He does complain of lethargy and fatigue and relates symptoms compatible with obstructive sleep apnea. He does wear his CPAP.     Since I saw him 2 years ago he is done well from a cardiac point of view.  He denies chest pain or shortness of breath.  He does relate new onset left calf claudication over the last 8 to 12 months occurring at less than 100 yards.   I performed lower extremity arterial Doppler studies on him 10/11/2018 revealing a right ABI of 1.13 and a left of 0.7.  He did have a high-frequency signal in his mid left SFA and what appeared to be an occluded left tibioperoneal trunk.   I performed peripheral angiography on him 11/05/2018 revealing high-grade calcified left common iliac artery stenosis, bilateral calcified mid at SFA stenoses with an occluded left popliteal artery.  One-vessel runoff on the left and 3 on the right.  I performed diamondback orbital rotational atherectomy, PTA and covered stenting of the entire left common iliac artery with a 8 mm x 59 mm long VBX covered stent.  She had an excellent  angiographic result.  His claudication is minimally improved.  His ABI really has not changed.  The velocities in his left iliac are normal.     He presented on 12/10/2018 for staged intervention of his left SFA and popliteal artery using diamondback orbital atherectomy, drug-coated balloon angioplasty and nitinol self-expanding stenting which was all successful.  He does have an occluded left popliteal artery with a large bridging collateral to an anterior tibial.  His follow-up Doppler studies performed 12/20/2018 revealed a left ABI 0.76 with a patent SFA.  His claudication had markedly improved.   Since I saw him in the office a year ago he continues to do well.  He denies chest pain or shortness of breath.  He also denies claudication.  He had carotid Dopplers performed 11/08/2021 that did show bilateral ICA stenosis in the moderate range and Dopplers performed July 07, 2021 revealing patent left iliac and SFA.   Current Meds  Medication Sig   aspirin EC 81 MG tablet Take 81 mg by mouth daily.   CIALIS 20 MG tablet Take 20 mg by mouth daily as needed for erectile dysfunction.    clopidogrel (PLAVIX) 75 MG tablet Take 75 mg by mouth daily with breakfast.   co-enzyme Q-10 30 MG capsule Take by mouth.   metoprolol succinate (TOPROL-XL) 50 MG 24 hr tablet Take 75 mg by mouth daily. Take with or immediately following a meal.  nitroGLYCERIN (NITROSTAT) 0.4 MG SL tablet Place 0.4 mg under the tongue every 5 (five) minutes as needed for chest pain.   olmesartan (BENICAR) 20 MG tablet Take 20 mg by mouth daily.   Omega-3 Fatty Acids (OMEGA 3 PO) Take 950 mg by mouth 2 (two) times daily.   rosuvastatin (CRESTOR) 40 MG tablet Take 40 mg by mouth every evening.    Saw Palmetto, Serenoa repens, (SAW PALMETTO PO) Take 1 capsule by mouth daily.    sertraline (ZOLOFT) 50 MG tablet Take 50 mg by mouth daily.   tamsulosin (FLOMAX) 0.4 MG CAPS capsule Take 0.8 mg by mouth every evening.    traMADol (ULTRAM) 50  MG tablet Take 50 mg by mouth 5 (five) times daily as needed (pain.).   Ubiquinone (ULTRA COQ10 PO) Take 1 capsule by mouth daily.     No Known Allergies  Social History   Socioeconomic History   Marital status: Married    Spouse name: Not on file   Number of children: Not on file   Years of education: Not on file   Highest education level: Not on file  Occupational History   Not on file  Tobacco Use   Smoking status: Former    Types: Cigarettes    Quit date: 02/01/2003    Years since quitting: 19.1   Smokeless tobacco: Former    Types: Chew  Substance and Sexual Activity   Alcohol use: Yes    Alcohol/week: 1.0 standard drink of alcohol    Types: 1 Cans of beer per week   Drug use: Not on file   Sexual activity: Not on file  Other Topics Concern   Not on file  Social History Narrative   Not on file   Social Determinants of Health   Financial Resource Strain: Not on file  Food Insecurity: Not on file  Transportation Needs: Not on file  Physical Activity: Not on file  Stress: Not on file  Social Connections: Unknown (11/05/2018)   Social Connection and Isolation Panel [NHANES]    Frequency of Communication with Friends and Family: More than three times a week    Frequency of Social Gatherings with Friends and Family: Not on file    Attends Religious Services: Not on file    Active Member of Clubs or Organizations: Not on file    Attends Archivist Meetings: Not on file    Marital Status: Not on file  Intimate Partner Violence: Not At Risk (11/05/2018)   Humiliation, Afraid, Rape, and Kick questionnaire    Fear of Current or Ex-Partner: No    Emotionally Abused: No    Physically Abused: No    Sexually Abused: No     Review of Systems: General: negative for chills, fever, night sweats or weight changes.  Cardiovascular: negative for chest pain, dyspnea on exertion, edema, orthopnea, palpitations, paroxysmal nocturnal dyspnea or shortness of  breath Dermatological: negative for rash Respiratory: negative for cough or wheezing Urologic: negative for hematuria Abdominal: negative for nausea, vomiting, diarrhea, bright red blood per rectum, melena, or hematemesis Neurologic: negative for visual changes, syncope, or dizziness All other systems reviewed and are otherwise negative except as noted above.    Blood pressure 130/86, pulse 70, height 5' 7"$  (1.702 m), weight 194 lb 9.6 oz (88.3 kg), SpO2 96 %.  General appearance: alert and no distress Neck: no adenopathy, no carotid bruit, no JVD, supple, symmetrical, trachea midline, and thyroid not enlarged, symmetric, no tenderness/mass/nodules Lungs: clear to auscultation  bilaterally Heart: regular rate and rhythm, S1, S2 normal, no murmur, click, rub or gallop Extremities: extremities normal, atraumatic, no cyanosis or edema Pulses: 2+ and symmetric Skin: Skin color, texture, turgor normal. No rashes or lesions Neurologic: Grossly normal  EKG sinus rhythm at 70 with left axis deviation.  I personally reviewed this EKG.  ASSESSMENT AND PLAN:   CAD CABG X 5 '05. Low risk Myoview 12/12 History of CAD status post coronary artery bypass grafting by Dr. Dahlia Byes in 2005 with a LIMA to the LAD, vein to PDA, OM1 and OM 2 sequentially.  He is completely asymptomatic denying chest pain or shortness of breath.  Mild carotid artery disease ([place]) History of moderate bilateral ICA stenosis by duplex ultrasound 10/23.  This will be repeated on an annual basis.  Hyperlipidemia LDL goal <70 History of hyperlipidemia on high-dose statin therapy with lipid profile performed 09/17/2021 revealing total cholesterol 108, LDL 49 and HDL of 28.  Claudication in peripheral vascular disease ([place]) History of PAD status post orbital atherectomy of his calcified left common iliac artery 11/05/2018 with staged intervention of his left SFA 12/10/2018.  This resulted in marked improvement in his  claudication symptoms.  He does have a known occluded left popliteal artery with one-vessel runoff via the anterior tibial.  He denies claudication.  His most recent Dopplers performed 07/07/2021 revealed a patent iliac and SFA on the left.  This will be repeated on an annual basis.     Lorretta Harp MD FACP,FACC,FAHA, [facility] 03/22/2022 2:18 PM

## 2022-03-22 NOTE — Assessment & Plan Note (Signed)
History of PAD status post orbital atherectomy of his calcified left common iliac artery 11/05/2018 with staged intervention of his left SFA 12/10/2018.  This resulted in marked improvement in his claudication symptoms.  He does have a known occluded left popliteal artery with one-vessel runoff via the anterior tibial.  He denies claudication.  His most recent Dopplers performed 07/07/2021 revealed a patent iliac and SFA on the left.  This will be repeated on an annual basis.

## 2022-03-22 NOTE — Assessment & Plan Note (Signed)
History of moderate bilateral ICA stenosis by duplex ultrasound 10/23.  This will be repeated on an annual basis.

## 2022-03-22 NOTE — Patient Instructions (Signed)
Medication Instructions:  Your physician recommends that you continue on your current medications as directed. Please refer to the Current Medication list given to you today.  *If you need a refill on your cardiac medications before your next appointment, please call your pharmacy*   Testing/Procedures: Your physician has requested that you have a carotid duplex. This test is an ultrasound of the carotid arteries in your neck. It looks at blood flow through these arteries that supply the brain with blood. Allow one hour for this exam. There are no restrictions or special instructions. This will take place at [address], [address]. To do in October.  Your physician has requested that you have an Aorta/Iliac Duplex. This will be take place at [place], [address].  No food after 11PM the night before.  Water is OK. (Don't drink liquids if you have been instructed not to for ANOTHER test) Avoid foods that produce bowel gas, for 24 hours prior to exam (see below). No breakfast, no chewing gum, no smoking or carbonated beverages. Patient may take morning medications with water. Come in for test at least 15 minutes early to register.  To do in October.  Your physician has requested that you have a lower extremity arterial duplex. During this test, ultrasound is used to evaluate arterial blood flow in the legs. Allow one hour for this exam. There are no restrictions or special instructions. This will take place at [place], [address]. To do in October.  Your physician has requested that you have an ankle brachial index (ABI). During this test an ultrasound and blood pressure cuff are used to evaluate the arteries that supply the arms and legs with blood. Allow thirty minutes for this exam. There are no restrictions or special instructions. This will take place at [place], [address]. To do in October.     Follow-Up: At [facility], you and your health  needs are our priority.  As part of our continuing mission to provide you with exceptional heart care, we have created designated Provider Care Teams.  These Care Teams include your primary Cardiologist (physician) and Advanced Practice Providers (APPs -  Physician Assistants and Nurse Practitioners) who all work together to provide you with the care you need, when you need it.  We recommend signing up for the patient portal called "MyChart".  Sign up information is provided on this After Visit Summary.  MyChart is used to connect with patients for Virtual Visits (Telemedicine).  Patients are able to view lab/test results, encounter notes, upcoming appointments, etc.  Non-urgent messages can be sent to your provider as well.   To learn more about what you can do with MyChart, go to [email or web address].    Your next appointment:   12 month(s)  Provider:   [name], MD     Other Instructions Ok to hold plavix for 5 days prior to colonoscopy per Dr. [name].

## 2022-03-22 NOTE — Assessment & Plan Note (Signed)
History of hyperlipidemia on high-dose statin therapy with lipid profile performed 09/17/2021 revealing total cholesterol 108, LDL 49 and HDL of 28.

## 2022-03-22 NOTE — Assessment & Plan Note (Signed)
History of CAD status post coronary artery bypass grafting by Dr. Dahlia Byes in 2005 with a LIMA to the LAD, vein to PDA, OM1 and OM 2 sequentially.  He is completely asymptomatic denying chest pain or shortness of breath.

## 2022-03-23 NOTE — Telephone Encounter (Signed)
Left message for patient to call office.  

## 2022-03-24 DIAGNOSIS — E785 Hyperlipidemia, unspecified: Secondary | ICD-10-CM | POA: Diagnosis not present

## 2022-03-24 DIAGNOSIS — I10 Essential (primary) hypertension: Secondary | ICD-10-CM | POA: Diagnosis not present

## 2022-03-26 NOTE — Progress Notes (Signed)
Agree with assessment/plan.  Raj Kouper Spinella, MD [facility] [phone]  

## 2022-03-29 DIAGNOSIS — I10 Essential (primary) hypertension: Secondary | ICD-10-CM | POA: Diagnosis not present

## 2022-03-29 DIAGNOSIS — R7301 Impaired fasting glucose: Secondary | ICD-10-CM | POA: Diagnosis not present

## 2022-03-29 DIAGNOSIS — Z6829 Body mass index (BMI) 29.0-29.9, adult: Secondary | ICD-10-CM | POA: Diagnosis not present

## 2022-03-29 DIAGNOSIS — E785 Hyperlipidemia, unspecified: Secondary | ICD-10-CM | POA: Diagnosis not present

## 2022-03-30 NOTE — Telephone Encounter (Signed)
Left message for patient to call office.  

## 2022-03-30 NOTE — Telephone Encounter (Signed)
Patient informed to hold Plavix.

## 2022-04-04 ENCOUNTER — Encounter: Payer: Self-pay | Admitting: Gastroenterology

## 2022-04-04 ENCOUNTER — Ambulatory Visit (AMBULATORY_SURGERY_CENTER): Payer: BC Managed Care – PPO | Admitting: Gastroenterology

## 2022-04-04 VITALS — BP 107/56 | HR 75 | Temp 98.6°F | Resp 17 | Ht 67.0 in | Wt 196.0 lb

## 2022-04-04 DIAGNOSIS — Z1211 Encounter for screening for malignant neoplasm of colon: Secondary | ICD-10-CM

## 2022-04-04 DIAGNOSIS — D127 Benign neoplasm of rectosigmoid junction: Secondary | ICD-10-CM | POA: Diagnosis not present

## 2022-04-04 DIAGNOSIS — K635 Polyp of colon: Secondary | ICD-10-CM | POA: Diagnosis not present

## 2022-04-04 MED ORDER — SODIUM CHLORIDE 0.9 % IV SOLN: 500.0000 mL | Freq: Once | INTRAVENOUS | Status: DC

## 2022-04-04 NOTE — Patient Instructions (Signed)
YOU HAD AN ENDOSCOPIC PROCEDURE TODAY AT [facility]:   Refer to the procedure report that was given to you for any specific questions about what was found during the examination.  If the procedure report does not answer your questions, please call your gastroenterologist to clarify.  If you requested that your care partner not be given the details of your procedure findings, then the procedure report has been included in a sealed envelope for you to review at your convenience later.  YOU SHOULD EXPECT: Some feelings of bloating in the abdomen. Passage of more gas than usual.  Walking can help get rid of the air that was put into your GI tract during the procedure and reduce the bloating. If you had a lower endoscopy (such as a colonoscopy or flexible sigmoidoscopy) you may notice spotting of blood in your stool or on the toilet paper. If you underwent a bowel prep for your procedure, you may not have a normal bowel movement for a few days.  Please Note:  You might notice some irritation and congestion in your nose or some drainage.  This is from the oxygen used during your procedure.  There is no need for concern and it should clear up in a day or so.  SYMPTOMS TO REPORT IMMEDIATELY:  Following lower endoscopy (colonoscopy or flexible sigmoidoscopy):  Excessive amounts of blood in the stool  Significant tenderness or worsening of abdominal pains  Swelling of the abdomen that is new, acute  Fever of 100F or higher   For urgent or emergent issues, a gastroenterologist can be reached at any hour by calling [phone]. Do not use MyChart messaging for urgent concerns.    DIET:  We do recommend a small meal at first, but then you may proceed to your regular diet.  Drink plenty of fluids but you should avoid alcoholic beverages for 24 hours.  MEDICATIONS: Continue present medications. No Aspirin, Ibuprofen, Naproxen, or other Non-Steroidal Anti-Inflammatory drugs for 5 days  after polyp removal. Resume Plavix (Clopidogrel) at prior dose in 5 days.  Handouts given to patient: Polyps, Diverticulosis, Hemorrhoids.  FOLLOW UP: Await pathology results.  Thank you for allowing [place] to provide for your healthcare needs today.  ACTIVITY:  You should plan to take it easy for the rest of today and you should NOT DRIVE or use heavy machinery until tomorrow (because of the sedation medicines used during the test).    FOLLOW UP: Our staff will call the number listed on your records the next business day following your procedure.  We will call around 7:15- 8:00 am to check on you and address any questions or concerns that you may have regarding the information given to you following your procedure. If we do not reach you, we will leave a message.     If any biopsies were taken you will be contacted by phone or by letter within the next 1-3 weeks.  Please call us at [phone] if you have not heard about the biopsies in 3 weeks.    SIGNATURES/CONFIDENTIALITY: You and/or your care partner have signed paperwork which will be entered into your electronic medical record.  These signatures attest to the fact that that the information above on your After Visit Summary has been reviewed and is understood.  Full responsibility of the confidentiality of this discharge information lies with you and/or your care-partner.

## 2022-04-04 NOTE — Progress Notes (Unsigned)
1558 Manual BP 92/58

## 2022-04-04 NOTE — Op Note (Signed)
[place] Patient Name: Jorge Jackson Procedure Date: 04/04/2022 3:35 PM MRN: [id number] Endoscopist: Jackquline Denmark , MD, [id number] Age: 67 Referring MD:  Date of Birth: August 21, 1955 Gender: Male Account #: [id number] Procedure:                Colonoscopy Indications:              Screening for colorectal malignant neoplasm Medicines:                Monitored Anesthesia Care Procedure:                Pre-Anesthesia Assessment:                           - Prior to the procedure, a History and Physical                            was performed, and patient medications and                            allergies were reviewed. The patient's tolerance of                            previous anesthesia was also reviewed. The risks                            and benefits of the procedure and the sedation                            options and risks were discussed with the patient.                            All questions were answered, and informed consent                            was obtained. Prior Anticoagulants: The patient has                            taken Plavix (clopidogrel), last dose was 5 days                            prior to procedure. ASA Grade Assessment: II - A                            patient with mild systemic disease. After reviewing                            the risks and benefits, the patient was deemed in                            satisfactory condition to undergo the procedure.                           After obtaining informed consent, the colonoscope  was passed under direct vision. Throughout the                            procedure, the patient's blood pressure, pulse, and                            oxygen saturations were monitored continuously. The                            Olympus [id number]) Colonoscope was                            introduced through the anus and advanced to the 2                             cm into the ileum. The colonoscopy was performed                            without difficulty. The patient tolerated the                            procedure well. The quality of the bowel                            preparation was good. The terminal ileum, ileocecal                            valve, appendiceal orifice, and rectum were                            photographed. Scope In: 3:40:26 PM Scope Out: 3:54:57 PM Scope Withdrawal Time: 0 hours 10 minutes 54 seconds  Total Procedure Duration: 0 hours 14 minutes 31 seconds  Findings:                 Three polyps were found in the recto-sigmoid colon                            extending from 15 to 20 cm from the anal verge. The                            largest polyp was 10 mm on a short thick pedicle,                            removed with snare cautery polypectomy.                            Postprocedure photodocumentation of the site was                            obtained. 2 smaller polyps measured 4 mm and 6 mm                            respectively. These were  removed with cold snare                            polypectomy.                           Many medium-mouthed diverticula were found in the                            sigmoid colon, descending colon and few in                            ascending colon.                           Non-bleeding internal hemorrhoids were found during                            retroflexion. The hemorrhoids were small and Grade                            I (internal hemorrhoids that do not prolapse).                           The terminal ileum appeared normal.                           The exam was otherwise without abnormality on                            direct and retroflexion views. Complications:            No immediate complications. Estimated Blood Loss:     Estimated blood loss: none. Impression:               - Three 4 to 10 mm polyps at the recto-sigmoid                             colon, removed with a hot snare. Resected and                            retrieved.                           - Mild predominantly left colonic diverticulosis.                           - Non-bleeding internal hemorrhoids.                           - The examined portion of the ileum was normal.                           - The examination was otherwise normal on direct                            and retroflexion views. Recommendation:           -  Patient has a contact number available for                            emergencies. The signs and symptoms of potential                            delayed complications were discussed with the                            patient. Return to normal activities tomorrow.                            Written discharge instructions were provided to the                            patient.                           - Resume previous diet.                           - Continue present medications.                           - No aspirin, ibuprofen, naproxen, or other                            non-steroidal anti-inflammatory drugs for 5 days                            after polyp removal.                           - Resume Plavix (clopidogrel) at prior dose in 5                            days.                           - The findings and recommendations were discussed                            with the patient's family. Jackquline Denmark, MD 04/04/2022 4:00:33 PM This report has been signed electronically.

## 2022-04-04 NOTE — Progress Notes (Unsigned)
A/ox3, pleased with MAC, report to RN 

## 2022-04-04 NOTE — Progress Notes (Signed)
Chief Complaint: For screening colonoscopy  Referring Provider:  Jacelyn Pi, Irma M, *      ASSESSMENT AND PLAN;   #1. CRC screening  #2. PAD on plavix/bASA   Plan: -Colon after holding plavix x 5 days (cardiac clearence Dr [name]) with 2 day prep.  Jan 2024 - any Friday/or Monday will do.    Discussed risks & benefits of colonoscopy. Risks including rare perforation req laparotomy, bleeding after bx/polypectomy req blood transfusion, rarely missing neoplasms, risks of anesthesia/sedation, rare risk of damage to internal organs. Benefits outweigh the risks. Patient agrees to proceed. All the questions were answered. Pt consents to proceed.   HPI:    Jorge Jackson is a 67 y.o. male  CAD s/p CABD (Nl EF), PAD on plavix/bASA, OSA on CPAP, HTN, anxiety/depression, erectile dysfunction.  Here for screening colonoscopy  No nausea, vomiting, heartburn, regurgitation, odynophagia or dysphagia.  No significant diarrhea or constipation.  No melena or hematochezia. No unintentional weight loss. No abdominal pain.  Has Hoids-do act up when he is constipated  Previous GI work-up: Colonoscopy 04/07/2010 ([facility]): Mild sigmoid diverticulosis.  Small internal hemorrhoids.  Otherwise normal colonoscopy.  Limited due to prep.  SH- Daughter and Karna Dupes in [employer]/going to [facility] Past Medical History:  Diagnosis Date   Anxiety and depression    CAD (coronary artery disease) 03/2003   hx CABG X 5    Erectile dysfunction    H/O cardiovascular stress test 2012   EF 76%, normal perfusion   H/O echocardiogram 02/25/2009   EF >55%, suggestion of impaired LV relaxtion, tr MR   Hx of myocardial infarction 03/2003   cath-CABG   Hyperlipidemia LDL goal <70    Mild carotid artery disease ([address]) 08/12/2011   Lt carotid by ultrasound, 50-69%, rt 0-49%   PVD (peripheral vascular disease) ([place])     Past Surgical History:  Procedure Laterality Date   ABDOMINAL AORTOGRAM W/LOWER  EXTREMITY Bilateral 11/05/2018   Procedure: ABDOMINAL AORTOGRAM W/LOWER EXTREMITY;  Surgeon: Lorretta Harp, MD;  Location: [facility];  Service: Cardiovascular;  Laterality: Bilateral;   CARDIAC CATHETERIZATION     COLONOSCOPY  04/07/2010   Mild sigmoid diverticulosos. Small internal hemorrhoids. Otherwise grossly normal colonsocopy   CORONARY ARTERY BYPASS GRAFT  03/05/2003   LIMA-LAD; VG-PDA; VG-OM1; sequential VG-OM 2 and OM 3   LOWER EXTREMITY ANGIOGRAPHY Left 12/10/2018   Procedure: LOWER EXTREMITY ANGIOGRAPHY;  Surgeon: Lorretta Harp, MD;  Location: [facility];  Service: Cardiovascular;  Laterality: Left;   PERIPHERAL VASCULAR ATHERECTOMY  11/05/2018   Procedure: PERIPHERAL VASCULAR ATHERECTOMY;  Surgeon: Lorretta Harp, MD;  Location: [facility];  Service: Cardiovascular;;   PERIPHERAL VASCULAR ATHERECTOMY Left 12/10/2018   Procedure: PERIPHERAL VASCULAR ATHERECTOMY;  Surgeon: Lorretta Harp, MD;  Location: [place] [facility];  Service: Cardiovascular;  Laterality: Left;  Left SFA   PERIPHERAL VASCULAR INTERVENTION  11/05/2018   Procedure: PERIPHERAL VASCULAR INTERVENTION;  Surgeon: Lorretta Harp, MD;  Location: [facility];  Service: Cardiovascular;;   PERIPHERAL VASCULAR INTERVENTION Left 12/10/2018   Procedure: PERIPHERAL VASCULAR INTERVENTION;  Surgeon: Lorretta Harp, MD;  Location: [facility];  Service: Cardiovascular;  Laterality: Left;  left SFA    Family History  Problem Relation Age of Onset   Stroke Mother 31   Heart disease Mother    Sudden death Father    Colon cancer Neg Hx    Stomach cancer Neg Hx  Esophageal cancer Neg Hx     Social History   Tobacco Use   Smoking status: Former    Types: Cigarettes    Quit date: 02/01/2003    Years since quitting: 19.1   Smokeless tobacco: Former    Types: Chew  Substance Use Topics   Alcohol use: Yes    Alcohol/week: 1.0 standard drink of alcohol    Types: 1  Cans of beer per week   Drug use: Not Currently    Current Outpatient Medications  Medication Sig Dispense Refill   aspirin EC 81 MG tablet Take 81 mg by mouth daily.     co-enzyme Q-10 30 MG capsule Take by mouth.     metoprolol succinate (TOPROL-XL) 50 MG 24 hr tablet Take 75 mg by mouth daily. Take with or immediately following a meal.     olmesartan (BENICAR) 20 MG tablet Take 20 mg by mouth daily.     Omega-3 Fatty Acids (OMEGA 3 PO) Take 950 mg by mouth 2 (two) times daily.     rosuvastatin (CRESTOR) 40 MG tablet Take 40 mg by mouth every evening.      tamsulosin (FLOMAX) 0.4 MG CAPS capsule Take 0.8 mg by mouth every evening.      Ubiquinone (ULTRA COQ10 PO) Take 1 capsule by mouth daily.     CIALIS 20 MG tablet Take 20 mg by mouth daily as needed for erectile dysfunction.      clopidogrel (PLAVIX) 75 MG tablet Take 75 mg by mouth daily with breakfast.     nitroGLYCERIN (NITROSTAT) 0.4 MG SL tablet Place 0.4 mg under the tongue every 5 (five) minutes as needed for chest pain.     Saw Palmetto, Serenoa repens, (SAW PALMETTO PO) Take 1 capsule by mouth daily.      sertraline (ZOLOFT) 50 MG tablet Take 50 mg by mouth daily. (Patient not taking: Reported on 04/04/2022)     traMADol (ULTRAM) 50 MG tablet Take 50 mg by mouth 5 (five) times daily as needed (pain.).     Current Facility-Administered Medications  Medication Dose Route Frequency Provider Last Rate Last Admin   0.9 %  sodium chloride infusion  500 mL Intravenous Once Jackquline Denmark, MD        No Known Allergies  Review of Systems:  Constitutional: Denies fever, chills, diaphoresis, appetite change and fatigue.  HEENT: Denies photophobia, eye pain, redness, hearing loss, ear pain, congestion, sore throat, rhinorrhea, sneezing, mouth sores, neck pain, neck stiffness and tinnitus.   Respiratory: Denies SOB, DOE, cough, chest tightness,  and wheezing.   Cardiovascular: Denies chest pain, palpitations and leg swelling.   Genitourinary: Denies dysuria, urgency, frequency, hematuria, flank pain and difficulty urinating.  Musculoskeletal: Denies myalgias, has back pain, joint swelling, arthralgias and had gait problem.  Skin: No rash.  Neurological: Denies dizziness, seizures, syncope, weakness, light-headedness, numbness and headaches.  Hematological: Denies adenopathy. Easy bruising, personal or family bleeding history  Psychiatric/Behavioral: Has anxiety or depression     Physical Exam:    BP 113/73   Pulse 75   Temp 98.6 F (37 C)   Ht '5\' 7"'$  (1.702 m)   Wt 196 lb (88.9 kg)   SpO2 98%   BMI 30.70 kg/m  Wt Readings from Last 3 Encounters:  04/04/22 196 lb (88.9 kg)  03/22/22 194 lb 9.6 oz (88.3 kg)  03/21/22 198 lb 6.4 oz (90 kg)   Constitutional:  Well-developed, in no acute distress. Psychiatric: Normal mood and affect. Behavior is  normal. HEENT: Conjunctivae are normal. No scleral icterus.  Cardiovascular: Normal rate, regular rhythm. No edema Pulmonary/chest: Effort normal and breath sounds normal. No wheezing, rales or rhonchi. Abdominal: Soft, nondistended. Nontender. Bowel sounds active throughout. There are no masses palpable. No hepatomegaly. Rectal: Deferred Neurological: Alert and oriented to person place and time. Skin: Skin is warm and dry. No rashes noted.    Carmell Austria, MD 04/04/2022, 3:35 PM  Cc: Jacelyn Pi, Lilia Argue, *

## 2022-04-04 NOTE — Progress Notes (Unsigned)
Called to room to assist during endoscopic procedure.  Patient ID and intended procedure confirmed with present staff. Received instructions for my participation in the procedure from the performing physician.  

## 2022-04-05 ENCOUNTER — Telehealth: Payer: Self-pay | Admitting: *Deleted

## 2022-04-05 NOTE — Telephone Encounter (Signed)
  Follow up Call-     04/04/2022    3:11 PM  Call back number  Post procedure Call Back phone  # [phone]  Permission to leave phone message Yes  [facility]

## 2022-04-09 ENCOUNTER — Encounter: Payer: Self-pay | Admitting: Gastroenterology

## 2022-05-03 ENCOUNTER — Telehealth: Payer: Self-pay | Admitting: *Deleted

## 2022-05-03 NOTE — Telephone Encounter (Signed)
Patient calling for information on a sleep procedure that would have something put in his chest. 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

## 2022-05-11 DIAGNOSIS — H2513 Age-related nuclear cataract, bilateral: Secondary | ICD-10-CM | POA: Diagnosis not present

## 2022-05-11 DIAGNOSIS — H524 Presbyopia: Secondary | ICD-10-CM | POA: Diagnosis not present

## 2022-06-08 NOTE — Telephone Encounter (Signed)
See patient request regarding Implanted sleep device. Thank you Jim Like MHA RN CCM

## 2022-06-09 NOTE — Telephone Encounter (Signed)
Called patient, LVM to call back to further discuss.   Left call back number.

## 2022-06-09 NOTE — Telephone Encounter (Signed)
Patient returned call, he has not seen Dr.Kelly since 2018- recommended that he come in and be seen for a follow up to see if he meets requirements for inspire. Dr.Kelly would not be able to assess with the last visit this far away.   Scheduled on sleep clinic day September 30th.    Patient verbalized understanding.

## 2022-06-12 DIAGNOSIS — H9202 Otalgia, left ear: Secondary | ICD-10-CM | POA: Diagnosis not present

## 2022-06-19 DIAGNOSIS — H60552 Acute reactive otitis externa, left ear: Secondary | ICD-10-CM | POA: Diagnosis not present

## 2022-06-22 ENCOUNTER — Encounter: Payer: Self-pay | Admitting: Cardiovascular Disease

## 2022-09-13 DIAGNOSIS — E785 Hyperlipidemia, unspecified: Secondary | ICD-10-CM | POA: Diagnosis not present

## 2022-09-13 DIAGNOSIS — Z125 Encounter for screening for malignant neoplasm of prostate: Secondary | ICD-10-CM | POA: Diagnosis not present

## 2022-09-13 DIAGNOSIS — R7301 Impaired fasting glucose: Secondary | ICD-10-CM | POA: Diagnosis not present

## 2022-10-25 DIAGNOSIS — M545 Low back pain, unspecified: Secondary | ICD-10-CM | POA: Diagnosis not present

## 2022-10-25 DIAGNOSIS — R52 Pain, unspecified: Secondary | ICD-10-CM | POA: Diagnosis not present

## 2022-10-25 DIAGNOSIS — Z1331 Encounter for screening for depression: Secondary | ICD-10-CM | POA: Diagnosis not present

## 2022-10-25 DIAGNOSIS — G8929 Other chronic pain: Secondary | ICD-10-CM | POA: Diagnosis not present

## 2022-10-25 DIAGNOSIS — M47816 Spondylosis without myelopathy or radiculopathy, lumbar region: Secondary | ICD-10-CM | POA: Diagnosis not present

## 2022-10-30 DIAGNOSIS — Z79899 Other long term (current) drug therapy: Secondary | ICD-10-CM | POA: Diagnosis not present

## 2022-10-30 DIAGNOSIS — M546 Pain in thoracic spine: Secondary | ICD-10-CM | POA: Diagnosis not present

## 2022-10-30 DIAGNOSIS — M545 Low back pain, unspecified: Secondary | ICD-10-CM | POA: Diagnosis not present

## 2022-10-30 DIAGNOSIS — G8929 Other chronic pain: Secondary | ICD-10-CM | POA: Diagnosis not present

## 2022-10-30 DIAGNOSIS — Z6830 Body mass index (BMI) 30.0-30.9, adult: Secondary | ICD-10-CM | POA: Diagnosis not present

## 2022-10-31 ENCOUNTER — Encounter: Payer: Self-pay | Admitting: Cardiovascular Disease

## 2022-10-31 ENCOUNTER — Ambulatory Visit: Payer: BC Managed Care – PPO | Attending: Cardiovascular Disease | Admitting: Cardiovascular Disease

## 2022-10-31 DIAGNOSIS — G4733 Obstructive sleep apnea (adult) (pediatric): Secondary | ICD-10-CM

## 2022-10-31 DIAGNOSIS — Z951 Presence of aortocoronary bypass graft: Secondary | ICD-10-CM

## 2022-10-31 DIAGNOSIS — I2510 Atherosclerotic heart disease of native coronary artery without angina pectoris: Secondary | ICD-10-CM | POA: Diagnosis not present

## 2022-10-31 DIAGNOSIS — I10 Essential (primary) hypertension: Secondary | ICD-10-CM

## 2022-10-31 DIAGNOSIS — I6523 Occlusion and stenosis of bilateral carotid arteries: Secondary | ICD-10-CM

## 2022-10-31 DIAGNOSIS — E785 Hyperlipidemia, unspecified: Secondary | ICD-10-CM

## 2022-10-31 DIAGNOSIS — I739 Peripheral vascular disease, unspecified: Secondary | ICD-10-CM

## 2022-10-31 NOTE — Patient Instructions (Signed)
Medication Instructions:  No medication changes *If you need a refill on your cardiac medications before your next appointment, please call your pharmacy*   Lab Work: None  If you have labs (blood work) drawn today and your tests are completely normal, you will receive your results only by: MyChart Message (if you have MyChart) OR A paper copy in the mail If you have any lab test that is abnormal or we need to change your treatment, we will call you to review the results.   Follow-Up: At [facility], you and your health needs are our priority.  As part of our continuing mission to provide you with exceptional heart care, we have created designated Provider Care Teams.  These Care Teams include your primary Cardiologist (physician) and Advanced Practice Providers (APPs -  Physician Assistants and Nurse Practitioners) who all work together to provide you with the care you need, when you need it.  Your next appointment:   90 day(s)  Provider:   Dr. Nicki Guadalajara

## 2022-10-31 NOTE — Progress Notes (Signed)
Cardiology Office Note    Date:  11/07/2022   ID:  Jorge Jackson, DOB 06-Feb-1955, MRN [id number]  PCP:  Lezlie Lye, Meda Coffee, MD  Cardiologist:  Nicki Guadalajara, MD ( sleep); Dr. Allyson Sabal   History of Present Illness:  Jorge Jackson is a 67 y.o. male who I saw in 2018 for an initial sleep  evaluation following initiation of CPAP therapy.  I have not seen him since his initial evaluation in February 2018.  He presents for a greater than 6 1/2 year follow-up sleep evaluation.  Jorge Jackson is followed by Dr. Allyson Sabal for his cardiology care.  He underwent CABG revascularization surgery in February 2005 by Dr. Morton Peters and had a LIMA placed to his LAD, veins to the PDA, OM1, and OM2 vessels.  When he had recently seen Dr. Allyson Sabal.  He was complaining of lethargy and fatigue, and there was concern of symptoms associated with sleep apnea.  He was referred for a sleep study which was done on 12/11/2015.  This revealed moderate sleep apnea with an AHI of 19.1 per hour; however sleep apnea was severe during REM sleep with an AHI of 43.0.  There was significant oxygen desaturation to a nadir of 76% and  moderate snoring.  CPAP was implemented and he was titrated up to 16 cm water pressure.  At that pressure. AHI was 0 and minimum oxygen saturation was 94%.  His DME company is [employer] [facility] [employer] and is set up date was 02/12/2016.  He has a ResMed AirSense 10 AutoSet unit which has been at a set pressure of 16 cm.  He has a [employer] F 20 medium mask.  A download was obtained from 02/20/2016 through 03/20/2016.  He is meeting compliance standards with 90% of usage stays and 83% of days with usage greater than 4 hours.  However, his average use is only 6 hours and 5 minutes.  AHI is 8.8, with an apnea index of 7.0, and a hypopnea index of 1.8.  Central index was 0.7.  Except for 2 days, there was no significant leak.  Typically he goes to bed at 9 PM but wakes up at 3:50 AM since he works for [employer] and  is at work at 5 AM.  Since initiating CPAP, he feels better.  He is rested when he awakens.  The past he had significant snoring on his back.  An Epworth Sleepiness Scale score was calculated in the office and this endorsed at 12, consistent with mild residual daytime sleepiness as shown below.  Epworth Sleepiness Scale: Situation   Chance of Dozing/Sleeping (0 = never , 1 = slight chance , 2 = moderate chance , 3 = high chance )   sitting and reading 2   watching TV 2   sitting inactive in a public place 2   being a passenger in a motor vehicle for an hour or more 1   lying down in the afternoon 3   sitting and talking to someone 0   sitting quietly after lunch (no alcohol) 2   while stopped for a few minutes in traffic as the driver 0   Total Score  12   Since I last saw him, Jorge Jackson has continued to be followed by Dr. Allyson Sabal for cardiology care.  He has peripheral vascular disease in October 2020 angiography revealed high-grade calcified left common iliac artery stenosis, bilateral calcified mid SFA stenosis with occluded left popliteal artery.  He underwent  diamondback orbital rotational atherectomy of the left common iliac arter in October 2020 and in November 2020 underwent staged intervention of his left SFA and popliteal artery.  He last saw him in February 2024 and was remaining stable.  From a sleep perspective, he has continued to use CPAP.  I obtained a download from September 1 through October 31, 2022 which shows excellent compliance with 100% use and average sleep with CPAP at 6 hours 20 minutes.  His pressure set at a range of 12 to 20 cm and AHI was 3.0.  95th percentile pressure 16.1 with maximum average pressure 17.5.  He is in need to establish with a new DME company.  In addition, his CPAP device is over 6-1/65 years old and he qualifies for new machine.  He is in need for a new mask and his current mask shows moderate leak.  He presents for sleep consultation and further  evaluation.  Presently, he is on DAPT with aspirin/clopidogrel.  He continues to be on metoprolol succinate 75 mg and olmesartan 20 mg for hypertension.  He is on rosuvastatin 40 mg for hyperlipidemia.  He has a prescription for tadalafil as needed for erectile dysfunction.  He is also followed in pain clinic in [facility] due to back discomfort difficulties.  He presents for evaluation.   Past Medical History:  Diagnosis Date   Anxiety and depression    CAD (coronary artery disease) 03/2003   hx CABG X 5    Erectile dysfunction    H/O cardiovascular stress test 2012   EF 76%, normal perfusion   H/O echocardiogram 02/25/2009   EF >55%, suggestion of impaired LV relaxtion, tr MR   Hx of myocardial infarction 03/2003   cath-CABG   Hyperlipidemia LDL goal <70    Mild carotid artery disease (HCC) 08/12/2011   Lt carotid by ultrasound, 50-69%, rt 0-49%   PVD (peripheral vascular disease) (HCC)     Past Surgical History:  Procedure Laterality Date   ABDOMINAL AORTOGRAM W/LOWER EXTREMITY Bilateral 11/05/2018   Procedure: ABDOMINAL AORTOGRAM W/LOWER EXTREMITY;  Surgeon: Runell Gess, MD;  Location: [facility];  Service: Cardiovascular;  Laterality: Bilateral;   CARDIAC CATHETERIZATION     COLONOSCOPY  04/07/2010   Mild sigmoid diverticulosos. Small internal hemorrhoids. Otherwise grossly normal colonsocopy   CORONARY ARTERY BYPASS GRAFT  03/05/2003   LIMA-LAD; VG-PDA; VG-OM1; sequential VG-OM 2 and OM 3   LOWER EXTREMITY ANGIOGRAPHY Left 12/10/2018   Procedure: LOWER EXTREMITY ANGIOGRAPHY;  Surgeon: Runell Gess, MD;  Location: [facility];  Service: Cardiovascular;  Laterality: Left;   PERIPHERAL VASCULAR ATHERECTOMY  11/05/2018   Procedure: PERIPHERAL VASCULAR ATHERECTOMY;  Surgeon: Runell Gess, MD;  Location: [facility] INVASIVE CV LAB;  Service: Cardiovascular;;   PERIPHERAL VASCULAR ATHERECTOMY Left 12/10/2018   Procedure: PERIPHERAL VASCULAR ATHERECTOMY;   Surgeon: Runell Gess, MD;  Location: [facility] INVASIVE CV LAB;  Service: Cardiovascular;  Laterality: Left;  Left SFA   PERIPHERAL VASCULAR INTERVENTION  11/05/2018   Procedure: PERIPHERAL VASCULAR INTERVENTION;  Surgeon: Runell Gess, MD;  Location: [facility];  Service: Cardiovascular;;   PERIPHERAL VASCULAR INTERVENTION Left 12/10/2018   Procedure: PERIPHERAL VASCULAR INTERVENTION;  Surgeon: Runell Gess, MD;  Location: [facility];  Service: Cardiovascular;  Laterality: Left;  left SFA    Current Medications: Outpatient Medications Prior to Visit  Medication Sig Dispense Refill   aspirin EC 81 MG tablet Take 81 mg by mouth daily.  CIALIS 20 MG tablet Take 20 mg by mouth daily as needed for erectile dysfunction.      clopidogrel (PLAVIX) 75 MG tablet Take 75 mg by mouth daily with breakfast.     co-enzyme Q-10 30 MG capsule Take by mouth.     metoprolol succinate (TOPROL-XL) 50 MG 24 hr tablet Take 75 mg by mouth daily. Take with or immediately following a meal.     nitroGLYCERIN (NITROSTAT) 0.4 MG SL tablet Place 0.4 mg under the tongue every 5 (five) minutes as needed for chest pain.     olmesartan (BENICAR) 20 MG tablet Take 20 mg by mouth daily.     Omega-3 Fatty Acids (OMEGA 3 PO) Take 950 mg by mouth 2 (two) times daily.     rosuvastatin (CRESTOR) 40 MG tablet Take 40 mg by mouth every evening.      Saw Palmetto, Serenoa repens, (SAW PALMETTO PO) Take 1 capsule by mouth daily.      tamsulosin (FLOMAX) 0.4 MG CAPS capsule Take 0.8 mg by mouth every evening.      traMADol (ULTRAM) 50 MG tablet Take 50 mg by mouth 5 (five) times daily as needed (pain.).     Ubiquinone (ULTRA COQ10 PO) Take 1 capsule by mouth daily.     sertraline (ZOLOFT) 50 MG tablet Take 50 mg by mouth daily. (Patient not taking: Reported on 04/04/2022)     Facility-Administered Medications Prior to Visit  Medication Dose Route Frequency Provider Last Rate Last Admin   0.9 %  sodium  chloride infusion  500 mL Intravenous Once Lynann Bologna, MD         Allergies:   Patient has no known allergies.   Social History   Socioeconomic History   Marital status: Married    Spouse name: Not on file   Number of children: Not on file   Years of education: Not on file   Highest education level: Not on file  Occupational History   Not on file  Tobacco Use   Smoking status: Former    Current packs/day: 0.00    Types: Cigarettes    Quit date: 02/01/2003    Years since quitting: 19.7   Smokeless tobacco: Former    Types: Chew  Substance and Sexual Activity   Alcohol use: Yes    Alcohol/week: 1.0 standard drink of alcohol    Types: 1 Cans of beer per week   Drug use: Not Currently   Sexual activity: Not on file  Other Topics Concern   Not on file  Social History Narrative   Not on file   Social Determinants of Health   Financial Resource Strain: Medium Risk (10/25/2022)   Received from [facility]   Overall Financial Resource Strain (CARDIA)    Difficulty of Paying Living Expenses: Somewhat hard  Food Insecurity: No Food Insecurity (10/25/2022)   Received from [facility]   Hunger Vital Sign    Worried About Running Out of Food in the Last Year: Never true    Ran Out of Food in the Last Year: Never true  Transportation Needs: No Transportation Needs (10/25/2022)   Received from [facility] - Transportation    Lack of Transportation (Medical): No    Lack of Transportation (Non-Medical): No  Physical Activity: Not on file  Stress: Not on file  Social Connections: Unknown (10/10/2022)   Received from [facility]   Social Network    Social Network: Not on file  Family History:  The patient's family history includes Heart disease in his mother; Stroke (age of onset: 19) in his mother; Sudden death in his father.   ROS General: Negative; No fevers, chills, or night sweats;  HEENT: Negative; No changes in vision or hearing, sinus congestion,  difficulty swallowing Pulmonary: Negative; No cough, wheezing, shortness of breath, hemoptysis Cardiovascular: Status post CABG, status post no atherectomy lower extremity for PVD; no recent chest pain or increasing shortness of breath GI: Negative; No nausea, vomiting, diarrhea, or abdominal pain GU: Negative; No dysuria, hematuria, or difficulty voiding Musculoskeletal: Back discomfort Hematologic/Oncology: Negative; no easy bruising, bleeding Endocrine: Negative; no heat/cold intolerance; no diabetes Neuro: Negative; no changes in balance, headaches Skin: Negative; No rashes or skin lesions Psychiatric: Negative; No behavioral problems, depression Sleep: OSA on CPAP therapy with initial set up date February 12, 2016 with previous [facility] as his DME company.    No residual daytime sleepiness, no bruxism, restless legs, hypnogognic hallucinations, no cataplexy Other comprehensive 14 point system review is negative.   PHYSICAL EXAM:   VS:  BP 126/76   Pulse 67   Ht 5\' 7"  (1.702 m)   Wt 193 lb 9.6 oz (87.8 kg)   SpO2 93%   BMI 30.32 kg/m     Repeat blood pressure by me 122/72  Wt Readings from Last 3 Encounters:  10/31/22 193 lb 9.6 oz (87.8 kg)  04/04/22 196 lb (88.9 kg)  03/22/22 194 lb 9.6 oz (88.3 kg)    General: Alert, oriented, no distress.  Skin: normal turgor, no rashes, warm and dry HEENT: Normocephalic, atraumatic. Pupils equal round and reactive to light; sclera anicteric; extraocular muscles intact;  Nose without nasal septal hypertrophy Mouth/Parynx benign; Mallinpatti scale 3 Neck: No JVD, no carotid bruits; normal carotid upstroke Lungs: clear to ausculatation and percussion; no wheezing or rales Chest wall: without tenderness to palpitation Heart: PMI not displaced, RRR, s1 s2 normal, 1/6 systolic murmur, no diastolic murmur, no rubs, gallops, thrills, or heaves Abdomen: soft, nontender; no hepatosplenomehaly, BS+; abdominal aorta nontender and not  dilated by palpation. Back: no CVA tenderness Pulses 2+ Musculoskeletal: full range of motion, normal strength, no joint deformities Extremities: no clubbing cyanosis or edema, Homan's sign negative  Neurologic: grossly nonfocal; Cranial nerves grossly wnl Psychologic: Normal mood and affect    Studies/Labs Reviewed:   EKG Interpretation Date/Time:  Monday October 31 2022 10:50:12 EDT Ventricular Rate:  72 PR Interval:  196 QRS Duration:  92 QT Interval:  378 QTC Calculation: 413 R Axis:   -28  Text Interpretation: Sinus rhythm with occasional Premature ventricular complexes Nonspecific T wave abnormality When compared with ECG of 06-Mar-2003 07:29, Premature ventricular complexes are now Present Vent. rate has decreased BY  40 BPM QRS axis Shifted left ST no longer elevated in Anterolateral leads T wave inversion no longer evident in Inferior leads Confirmed by Nicki Guadalajara ([id number]) on 10/31/2022 12:21:45 PM    I personally reviewed his last ECG from 11/06/2015 which showed normal sinus rhythm at 67 bpm.  There was no ectopy.  There were normal intervals.  Recent Labs:    Latest Ref Rng & Units 12/11/2018    3:41 AM 12/06/2018    2:21 PM 11/06/2018    3:27 AM  BMP  Glucose 70 - 99 mg/dL 756  77  95   BUN 8 - 23 mg/dL 6  11  9    Creatinine 0.61 - 1.24 mg/dL 4.33  2.95  1.88  BUN/Creat Ratio 10 - 24  13    Sodium 135 - 145 mmol/L 138  139  138   Potassium 3.5 - 5.1 mmol/L 4.1  4.2  4.0   Chloride 98 - 111 mmol/L 104  101  105   CO2 22 - 32 mmol/L 29  23  24    Calcium 8.9 - 10.3 mg/dL 8.8  9.4  8.8          No data to display             Latest Ref Rng & Units 12/11/2018    3:41 AM 12/06/2018    2:21 PM 11/06/2018    3:27 AM  CBC  WBC 4.0 - 10.5 K/uL 8.6  7.0  8.3   Hemoglobin 13.0 - 17.0 g/dL 21.3  08.6  57.8   Hematocrit 39.0 - 52.0 % 37.5  39.5  41.1   Platelets 150 - 400 K/uL 170  219  192    Lab Results  Component Value Date   MCV 91.5 12/11/2018    MCV 90 12/06/2018   MCV 91.5 11/06/2018   No results found for: "TSH" No results found for: "HGBA1C"   BNP No results found for: "BNP"  ProBNP No results found for: "PROBNP"   Lipid Panel  No results found for: "CHOL", "TRIG", "HDL", "CHOLHDL", "VLDL", "LDLCALC", "LDLDIRECT"   RADIOLOGY: No results found.   Additional studies/ records that were reviewed today include:  Office notes from Dr. Allyson Sabal.  Sleep study and sleep downloads.   ASSESSMENT:    1. OSA (obstructive sleep apnea)   2. Coronary artery disease involving native coronary artery of native heart without angina pectoris   3. Hx of CABG x 5   4. Primary hypertension   5. PAD (peripheral artery disease) (HCC)   6. Bilateral carotid artery stenosis   7. Hyperlipidemia LDL goal <70     PLAN:  Mr. Ahamed Jackson is a 67 year old gentleman who has a history of coronary as well as peripheral vascular disease.  He underwent prior prior CABG revascularization surgery and staged complex diamondback orbital atherectomy of his left common iliac artery and left SFA and popliteal artery.  He also has mild left internal carotid artery stenosis.  Due to concerns for obstructive sleep apnea, he was found to have moderate overall sleep apnea with an AHI of 19.1/h however sleep apnea was severe during REM sleep with an AHI of 43.0 on his sleep evaluation in November 2017.  He had significant oxygen desaturation to a nadir of 76% and moderate snoring.  He was set up with a ResMed air sense 10 AutoSet unit with [facility] as his DME company in January 2018.  He has been on CPAP therapy ever since.  When I saw him for my initial sleep evaluation on March 23, 2016, he was sleeping approximately 6 hours per night.  His initial pressure was set at a pressure of 16 cm.  His AHI remained elevated at 8.8 and he had residual daytime sleepiness.  He was waking up at 3:30 in the morning to be at work at 5 AM.  During that evaluation I  changed his CPAP mode to an auto mode with a range of 12 to 20 cm of water pressure.  Jorge Jackson CPAP device is now over 78 and half years old.  He is in need for a new DME company.  He has moderate mask leak noted on download.  I was able to obtain  a download today from September 1 through October 31, 2022.  Usage days is excellent at 100% and average use is only minimally improved and still suboptimal at 6 hours and 20 minutes.  At his pressure range of 12 to 20 cm, AHI is 3.0.  His 95th percentile pressure 16.1 with maximum average pressure 17.5.  Presently, I again had a long discussion with Jorge Jackson.  I discussed the importance of continued therapy as well as optimal sleep duration.  I discussed potential adverse cardiovascular consequences of sleep apnea including effects on hypertension, potential nocturnal arrhythmias including atrial fibrillation, effects on insulin resistance, inflammatory markers, as well as increased nocturnal GERD.  With his established coronary artery disease, also discussed potential for nocturnal hypoxemia contributing to nocturnal ischemia.  I commended him on his 100% usage compliance and again stressed the importance of sleeping a little bit longer.  He still has to get up very early for work should go to bed sooner.  His typical work is a 10-hour day.  In the office today I provided him with a new ResMed AirFit F20 mask.  Based on his insurance, we will refer him to [facility].  He qualifies for new machine and have recommended he receive a new ResMed AirSense 11 AutoSet unit with a pressure range of 13 to 20 cm and EPR of 3.  I will need to see him within 90 days after he receives a new machine for compliance and follow-up evaluation.  His blood pressure today is stable on his current regimen of metoprolol succinate 75 mg and olmesartan 20 mg.  He is on rosuvastatin 40 mg for hyperlipidemia.  Lipid studies on September 13, 2022 showed an LDL of 52.  He continues to be  on DAPT with his prior CABG revascularization and peripheral vascular intervention.   Medication Adjustments/Labs and Tests Ordered: Current medicines are reviewed at length with the patient today.  Concerns regarding medicines are outlined above.  Medication changes, Labs and Tests ordered today are listed in the Patient Instructions below. Patient Instructions  Medication Instructions:  No medication changes *If you need a refill on your cardiac medications before your next appointment, please call your pharmacy*   Lab Work: None  If you have labs (blood work) drawn today and your tests are completely normal, you will receive your results only by: MyChart Message (if you have MyChart) OR A paper copy in the mail If you have any lab test that is abnormal or we need to change your treatment, we will call you to review the results.   Follow-Up: At [facility], you and your health needs are our priority.  As part of our continuing mission to provide you with exceptional heart care, we have created designated Provider Care Teams.  These Care Teams include your primary Cardiologist (physician) and Advanced Practice Providers (APPs -  Physician Assistants and Nurse Practitioners) who all work together to provide you with the care you need, when you need it.  Your next appointment:   90 day(s)  Provider:   Dr. Nicki Guadalajara       Signed, Nicki Guadalajara, MD  11/07/2022 9:05 AM    [facility] [address], [place], [place]  [address] Phone: [phone]

## 2022-11-04 DIAGNOSIS — M545 Low back pain, unspecified: Secondary | ICD-10-CM | POA: Diagnosis not present

## 2022-11-04 DIAGNOSIS — G8929 Other chronic pain: Secondary | ICD-10-CM | POA: Diagnosis not present

## 2022-11-04 DIAGNOSIS — Z6830 Body mass index (BMI) 30.0-30.9, adult: Secondary | ICD-10-CM | POA: Diagnosis not present

## 2022-11-04 DIAGNOSIS — M546 Pain in thoracic spine: Secondary | ICD-10-CM | POA: Diagnosis not present

## 2022-11-04 DIAGNOSIS — F1190 Opioid use, unspecified, uncomplicated: Secondary | ICD-10-CM | POA: Diagnosis not present

## 2022-11-04 DIAGNOSIS — Z79899 Other long term (current) drug therapy: Secondary | ICD-10-CM | POA: Diagnosis not present

## 2022-11-07 ENCOUNTER — Encounter (HOSPITAL_COMMUNITY): Payer: BC Managed Care – PPO

## 2022-11-07 ENCOUNTER — Encounter: Payer: Self-pay | Admitting: Cardiovascular Disease

## 2022-11-07 ENCOUNTER — Ambulatory Visit (HOSPITAL_BASED_OUTPATIENT_CLINIC_OR_DEPARTMENT_OTHER)
Admission: RE | Admit: 2022-11-07 | Discharge: 2022-11-07 | Disposition: A | Discharge status: Home / Self Care | Payer: BC Managed Care – PPO | Source: Ambulatory Visit | Attending: Cardiovascular Disease | Admitting: Cardiovascular Disease

## 2022-11-07 ENCOUNTER — Ambulatory Visit (HOSPITAL_COMMUNITY)
Admission: RE | Admit: 2022-11-07 | Discharge: 2022-11-07 | Disposition: A | Discharge status: Home / Self Care | Payer: BC Managed Care – PPO | Source: Ambulatory Visit | Attending: Cardiology | Admitting: Cardiology

## 2022-11-07 DIAGNOSIS — I6523 Occlusion and stenosis of bilateral carotid arteries: Secondary | ICD-10-CM | POA: Insufficient documentation

## 2022-11-07 DIAGNOSIS — I739 Peripheral vascular disease, unspecified: Secondary | ICD-10-CM | POA: Diagnosis not present

## 2022-11-07 DIAGNOSIS — Z95828 Presence of other vascular implants and grafts: Secondary | ICD-10-CM | POA: Insufficient documentation

## 2022-11-07 DIAGNOSIS — E785 Hyperlipidemia, unspecified: Secondary | ICD-10-CM

## 2022-11-07 DIAGNOSIS — I779 Disorder of arteries and arterioles, unspecified: Secondary | ICD-10-CM

## 2022-11-07 DIAGNOSIS — I2510 Atherosclerotic heart disease of native coronary artery without angina pectoris: Secondary | ICD-10-CM

## 2022-11-08 DIAGNOSIS — Z79899 Other long term (current) drug therapy: Secondary | ICD-10-CM | POA: Diagnosis not present

## 2022-11-09 LAB — VAS US ABI WITH/WO TBI
Left ABI: 0.64
Right ABI: 1.03

## 2022-11-16 ENCOUNTER — Encounter: Payer: Self-pay | Admitting: Cardiovascular Disease

## 2022-11-16 ENCOUNTER — Ambulatory Visit: Payer: BC Managed Care – PPO | Attending: Cardiovascular Disease | Admitting: Cardiovascular Disease

## 2022-11-16 VITALS — BP 116/82 | HR 69 | Ht 67.0 in | Wt 190.4 lb

## 2022-11-16 DIAGNOSIS — I2510 Atherosclerotic heart disease of native coronary artery without angina pectoris: Secondary | ICD-10-CM | POA: Diagnosis not present

## 2022-11-16 DIAGNOSIS — I739 Peripheral vascular disease, unspecified: Secondary | ICD-10-CM

## 2022-11-16 DIAGNOSIS — E785 Hyperlipidemia, unspecified: Secondary | ICD-10-CM

## 2022-11-16 DIAGNOSIS — I6523 Occlusion and stenosis of bilateral carotid arteries: Secondary | ICD-10-CM | POA: Diagnosis not present

## 2022-11-16 DIAGNOSIS — I779 Disorder of arteries and arterioles, unspecified: Secondary | ICD-10-CM | POA: Diagnosis not present

## 2022-11-16 NOTE — Assessment & Plan Note (Signed)
History of hyperlipidemia on statin therapy with lipid profile performed 09/13/2022 revealing total cholesterol 117, 52 and HDL 42.

## 2022-11-16 NOTE — Assessment & Plan Note (Signed)
History of moderate bilateral ICA stenosis by duplex ultrasound performed 11/07/2022.  This will be repeated in 1 year.

## 2022-11-16 NOTE — Assessment & Plan Note (Signed)
History of CAD status post coronary artery bypass grafting in 2005 by Dr. Lovett Sox with a LIMA to his LAD, vein to PDA, OM 1 and 2 sequentially.  He has been asymptomatic since.

## 2022-11-16 NOTE — Assessment & Plan Note (Signed)
History of PAD status post left common iliac orbital atherectomy, PTA and covered stenting using an 8 mm x 59 mm VBX covered stent 11/05/2018.  Because of segmental high-grade calcified mid and distal left SFA stenosis he underwent a staged procedure 12/10/2018 using orbital atherectomy and distal protection.  He did have a known occluded left popliteal artery with a large bridging collateral just proximal to this to the anterior tibial.  I placed a 6 x 60 mm stent in the proximal SFA reducing a 99% stenosis to less than 10% and I performed orbital atherectomy and DCB of the distal lesion reducing it to less than 10%.  His Dopplers improved and his claudication did as well.  Over the last several months he has noticed increasing claudication in his left calf.  His left ABI his going down from 0.82-0.64.  His left iliac stent is patent as is his proximal left SFA stent although it appears that his distal left SFA has severe restenosis.  At this point, he does not think his symptoms are severe enough to warrant reintervention.  We will readdress this in 6 months.

## 2022-11-16 NOTE — Patient Instructions (Signed)
Medication Instructions:  Your physician recommends that you continue on your current medications as directed. Please refer to the Current Medication list given to you today.  *If you need a refill on your cardiac medications before your next appointment, please call your pharmacy*    Testing/Procedures: Your physician has requested that you have a lower extremity arterial duplex. During this test, ultrasound is used to evaluate arterial blood flow in the legs. Allow one hour for this exam. There are no restrictions or special instructions. This will take place at [address] [facility], [address]. To be done in April.   Your physician has requested that you have an ankle brachial index (ABI). During this test an ultrasound and blood pressure cuff are used to evaluate the arteries that supply the arms and legs with blood. Allow thirty minutes for this exam. There are no restrictions or special instructions. This will take place at [address] [facility], [address]. To be done in April.   Your physician has requested that you have a carotid duplex. This test is an ultrasound of the carotid arteries in your neck. It looks at blood flow through these arteries that supply the brain with blood. Allow one hour for this exam. There are no restrictions or special instructions. This will take place at [address] [facility], [address]. To be done in October 2025.      Follow-Up: At [facility], you and your health needs are our priority.  As part of our continuing mission to provide you with exceptional heart care, we have created designated Provider Care Teams.  These Care Teams include your primary Cardiologist (physician) and Advanced Practice Providers (APPs -  Physician Assistants and Nurse Practitioners) who all work together to provide you with the care you need, when you need it.  We recommend signing up for the patient portal called "MyChart".  Sign up information is provided on this After Visit  Summary.  MyChart is used to connect with patients for Virtual Visits (Telemedicine).  Patients are able to view lab/test results, encounter notes, upcoming appointments, etc.  Non-urgent messages can be sent to your provider as well.   To learn more about what you can do with MyChart, go to [email or web address].    Your next appointment:   6 month(s)   Provider:   Nanetta Batty, MD

## 2022-11-16 NOTE — Progress Notes (Signed)
11/16/2022 Jorge Jackson   May 13, 1955  [id number]  Primary Physician Lezlie Lye, Meda Coffee, MD Primary Cardiologist: Runell Gess MD FACP, [place], [place], [place]  HPI:  Jorge Jackson is a 67 y.o.  mildly overweight married Caucasian male father of 1, grandfather to 1 grandson who I last saw in the office 03/22/2018.[name] He lost his job at the end of 2011. 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 He had coronary artery bypass grafting in February of 2005 by Dr. Kathlee Nations Trigt with a LIMA to his LAD, a vein to a PDA, OM1 and OM2 sequentially. He is totally asymptomatic. He has mild left internal carotid artery stenosis which we follow by duplex ultrasound. Dr. Samuel Germany follows his lipid profile. His last Myoview performed January 20, 2011 was nonischemic He does complain of lethargy and fatigue and relates symptoms compatible with obstructive sleep apnea. He does wear his CPAP.     Since I saw him 2 years ago he is done well from a cardiac point of view.  He denies chest pain or shortness of breath.  He does relate new onset left calf claudication over the last 8 to 12 months occurring at less than 100 yards.   I performed lower extremity arterial Doppler studies on him 10/11/2018 revealing a right ABI of 1.13 and a left of 0.7.  He did have a high-frequency signal in his mid left SFA and what appeared to be an occluded left tibioperoneal trunk.   I performed peripheral angiography on him 11/05/2018 revealing high-grade calcified left common iliac artery stenosis, bilateral calcified mid at SFA stenoses with an occluded left popliteal artery.  One-vessel runoff on the left and 3 on the right.  I performed diamondback orbital rotational atherectomy, PTA and covered stenting of the entire left common iliac artery with a 8 mm x 59 mm long VBX covered stent.  She had an excellent  angiographic result.  His claudication is minimally improved.  His ABI really has not changed.  The velocities in his left iliac are normal.     He presented on 12/10/2018 for staged intervention of his left SFA and popliteal artery using diamondback orbital atherectomy, drug-coated balloon angioplasty and nitinol self-expanding stenting which was all successful.  He does have an occluded left popliteal artery with a large bridging collateral to an anterior tibial.  His follow-up Doppler studies performed 12/20/2018 revealed a left ABI 0.76 with a patent SFA.  His claudication had markedly improved.   Since I saw him in the office approximately 8 months ago he has noticed some increasing left calf claudication.  He denies chest pain or shortness of breath.  His lower extremity arterial Doppler studies performed 11/07/2022 revealed a decline in his left ABI from 0.82-0.64.  His left iliac stent is patent as is his proximal left SFA stent although he appears to have had progression of disease in his distal left SFA.  At this point, he does not feel his symptoms are severe enough to warrant reintervention.   Current Meds  Medication Sig   aspirin EC 81 MG tablet Take 81 mg by mouth daily.   CIALIS 20 MG tablet Take 20 mg by mouth daily as needed for erectile dysfunction.    clopidogrel (PLAVIX) 75 MG tablet Take 75 mg by mouth daily with breakfast.  co-enzyme Q-10 30 MG capsule Take by mouth.   metoprolol succinate (TOPROL-XL) 50 MG 24 hr tablet Take 75 mg by mouth daily. Take with or immediately following a meal.   nitroGLYCERIN (NITROSTAT) 0.4 MG SL tablet Place 0.4 mg under the tongue every 5 (five) minutes as needed for chest pain.   olmesartan (BENICAR) 20 MG tablet Take 20 mg by mouth daily.   Omega-3 Fatty Acids (OMEGA 3 PO) Take 950 mg by mouth 2 (two) times daily.   rosuvastatin (CRESTOR) 40 MG tablet Take 40 mg by mouth every evening.    Saw Palmetto, Serenoa repens, (SAW PALMETTO PO) Take 1  capsule by mouth daily.    sertraline (ZOLOFT) 50 MG tablet Take 50 mg by mouth daily.   tamsulosin (FLOMAX) 0.4 MG CAPS capsule Take 0.8 mg by mouth every evening.    traMADol (ULTRAM) 50 MG tablet Take 50 mg by mouth 5 (five) times daily as needed (pain.).   Ubiquinone (ULTRA COQ10 PO) Take 1 capsule by mouth daily.   Current Facility-Administered Medications for the 11/16/22 encounter (Office Visit) with Runell Gess, MD  Medication   0.9 %  sodium chloride infusion     No Known Allergies  Social History   Socioeconomic History   Marital status: Married    Spouse name: Not on file   Number of children: Not on file   Years of education: Not on file   Highest education level: Not on file  Occupational History   Not on file  Tobacco Use   Smoking status: Former    Current packs/day: 0.00    Types: Cigarettes    Quit date: 02/01/2003    Years since quitting: 19.8   Smokeless tobacco: Former    Types: Chew  Substance and Sexual Activity   Alcohol use: Yes    Alcohol/week: 1.0 standard drink of alcohol    Types: 1 Cans of beer per week   Drug use: Not Currently   Sexual activity: Not on file  Other Topics Concern   Not on file  Social History Narrative   Not on file   Social Determinants of Health   Financial Resource Strain: Medium Risk (10/25/2022)   Received from [facility]   Overall Financial Resource Strain (CARDIA)    Difficulty of Paying Living Expenses: Somewhat hard  Food Insecurity: No Food Insecurity (10/25/2022)   [facility] from [facility]   Hunger Vital Sign    Worried About Running Out of Food in the Last Year: Never true    Ran Out of Food in the Last Year: Never true  Transportation Needs: No Transportation Needs (10/25/2022)   Received from [facility] - Transportation    Lack of Transportation (Medical): No    Lack of Transportation (Non-Medical): No  Physical Activity: Not on file  Stress: Not on file  Social Connections:  Unknown (10/10/2022)   Received from [facility]   Social Network    Social Network: Not on file  Intimate Partner Violence: Unknown (10/10/2022)   Received from [facility]   HITS    Physically Hurt: Not on file    Insult or Talk Down To: Not on file    Threaten Physical Harm: Not on file    Scream or Curse: Not on file     Review of Systems: General: negative for chills, fever, night sweats or weight changes.  Cardiovascular: negative for chest pain, dyspnea on exertion, edema, orthopnea, palpitations, paroxysmal nocturnal dyspnea or  shortness of breath Dermatological: negative for rash Respiratory: negative for cough or wheezing Urologic: negative for hematuria Abdominal: negative for nausea, vomiting, diarrhea, bright red blood per rectum, melena, or hematemesis Neurologic: negative for visual changes, syncope, or dizziness All other systems reviewed and are otherwise negative except as noted above.    Blood pressure 116/82, pulse 69, height 5\' 7"  (1.702 m), weight 190 lb 6.4 oz (86.4 kg), SpO2 95%.  General appearance: alert and no distress Neck: no adenopathy, no carotid bruit, no JVD, supple, symmetrical, trachea midline, and thyroid not enlarged, symmetric, no tenderness/mass/nodules Lungs: clear to auscultation bilaterally Heart: regular rate and rhythm, S1, S2 normal, no murmur, click, rub or gallop Extremities: extremities normal, atraumatic, no cyanosis or edema Pulses: Diminished pedal pulses Skin: Skin color, texture, turgor normal. No rashes or lesions Neurologic: Grossly normal  EKG not performed today      ASSESSMENT AND PLAN:   CAD CABG X 5 '05. Low risk Myoview 12/12 History of CAD status post coronary artery bypass grafting in 2005 by Dr. Lovett Sox with a LIMA to his LAD, vein to PDA, OM 1 and 2 sequentially.  He has been asymptomatic since.  Mild carotid artery disease (HCC) History of moderate bilateral ICA stenosis by duplex ultrasound performed  11/07/2022.  This will be repeated in 1 year.  Hyperlipidemia LDL goal <70 History of hyperlipidemia on statin therapy with lipid profile performed 09/13/2022 revealing total cholesterol 117, 52 and HDL 42.  Claudication in peripheral vascular disease (HCC) History of PAD status post left common iliac orbital atherectomy, PTA and covered stenting using an 8 mm x 59 mm VBX covered stent 11/05/2018.  Because of segmental high-grade calcified mid and distal left SFA stenosis he underwent a staged procedure 12/10/2018 using orbital atherectomy and distal protection.  He did have a known occluded left popliteal artery with a large bridging collateral just proximal to this to the anterior tibial.  I placed a 6 x 60 mm stent in the proximal SFA reducing a 99% stenosis to less than 10% and I performed orbital atherectomy and DCB of the distal lesion reducing it to less than 10%.  His Dopplers improved and his claudication did as well.  Over the last several months he has noticed increasing claudication in his left calf.  His left ABI his going down from 0.82-0.64.  His left iliac stent is patent as is his proximal left SFA stent although it appears that his distal left SFA has severe restenosis.  At this point, he does not think his symptoms are severe enough to warrant reintervention.  We will readdress this in 6 months.     Runell Gess MD FACP,FACC,FAHA, [facility] 11/16/2022 4:48 PM

## 2022-11-18 ENCOUNTER — Encounter: Payer: Self-pay | Admitting: Cardiovascular Disease

## 2022-11-21 MED ORDER — NITROGLYCERIN 0.4 MG SL SUBL: 0.4000 mg | SUBLINGUAL_TABLET | SUBLINGUAL | 3 refills | Status: DC | PRN

## 2022-11-24 ENCOUNTER — Telehealth: Payer: Self-pay

## 2022-11-24 NOTE — Telephone Encounter (Signed)
[facility] mail order pharmacy needed Dr. Allyson Sabal to be aware of the interaction with medications nitroglycerin and Cialis. Pharmacy states that cialis + organiic nitrates is a potential hypotensive effects when cialis is used with nitrates. The combination is contraindicated ad an appropriate time inteval between administration of cialis and nitrates has not been established. Is Dr. Allyson Sabal aware of this interaction and does Dr. Allyson Sabal want pt to continue both medications? Please address    INV: [id number] Ph: [phone]

## 2022-11-24 NOTE — Telephone Encounter (Signed)
Dr Allyson Sabal is aware patient is taking medication. Patient is aware of how to take both medications. Okay to fill.

## 2022-11-25 NOTE — Telephone Encounter (Signed)
Called [facility] to inform them that Dr. Allyson Sabal is aware of this interaction. Pharmacy rep verbalized understanding and stated that they would refill pt's medication.

## 2022-12-05 DIAGNOSIS — M546 Pain in thoracic spine: Secondary | ICD-10-CM | POA: Diagnosis not present

## 2022-12-05 DIAGNOSIS — F1190 Opioid use, unspecified, uncomplicated: Secondary | ICD-10-CM | POA: Diagnosis not present

## 2022-12-05 DIAGNOSIS — M545 Low back pain, unspecified: Secondary | ICD-10-CM | POA: Diagnosis not present

## 2022-12-05 DIAGNOSIS — Z79899 Other long term (current) drug therapy: Secondary | ICD-10-CM | POA: Diagnosis not present

## 2022-12-05 DIAGNOSIS — Z6830 Body mass index (BMI) 30.0-30.9, adult: Secondary | ICD-10-CM | POA: Diagnosis not present

## 2022-12-05 DIAGNOSIS — G8929 Other chronic pain: Secondary | ICD-10-CM | POA: Diagnosis not present

## 2022-12-06 DIAGNOSIS — G4733 Obstructive sleep apnea (adult) (pediatric): Secondary | ICD-10-CM | POA: Diagnosis not present

## 2022-12-13 DIAGNOSIS — Z79899 Other long term (current) drug therapy: Secondary | ICD-10-CM | POA: Diagnosis not present

## 2023-01-04 ENCOUNTER — Ambulatory Visit: Payer: BC Managed Care – PPO | Attending: Cardiovascular Disease | Admitting: Cardiovascular Disease

## 2023-01-04 DIAGNOSIS — I6523 Occlusion and stenosis of bilateral carotid arteries: Secondary | ICD-10-CM

## 2023-01-04 DIAGNOSIS — F1190 Opioid use, unspecified, uncomplicated: Secondary | ICD-10-CM | POA: Diagnosis not present

## 2023-01-04 DIAGNOSIS — I779 Disorder of arteries and arterioles, unspecified: Secondary | ICD-10-CM

## 2023-01-04 DIAGNOSIS — Z79899 Other long term (current) drug therapy: Secondary | ICD-10-CM | POA: Diagnosis not present

## 2023-01-04 DIAGNOSIS — E785 Hyperlipidemia, unspecified: Secondary | ICD-10-CM

## 2023-01-04 DIAGNOSIS — M546 Pain in thoracic spine: Secondary | ICD-10-CM | POA: Diagnosis not present

## 2023-01-04 DIAGNOSIS — M545 Low back pain, unspecified: Secondary | ICD-10-CM | POA: Diagnosis not present

## 2023-01-04 DIAGNOSIS — Z951 Presence of aortocoronary bypass graft: Secondary | ICD-10-CM

## 2023-01-04 DIAGNOSIS — G4733 Obstructive sleep apnea (adult) (pediatric): Secondary | ICD-10-CM | POA: Diagnosis not present

## 2023-01-04 DIAGNOSIS — Z6831 Body mass index (BMI) 31.0-31.9, adult: Secondary | ICD-10-CM | POA: Diagnosis not present

## 2023-01-04 DIAGNOSIS — I739 Peripheral vascular disease, unspecified: Secondary | ICD-10-CM

## 2023-01-04 DIAGNOSIS — I10 Essential (primary) hypertension: Secondary | ICD-10-CM

## 2023-01-04 DIAGNOSIS — I2510 Atherosclerotic heart disease of native coronary artery without angina pectoris: Secondary | ICD-10-CM

## 2023-01-04 DIAGNOSIS — G8929 Other chronic pain: Secondary | ICD-10-CM | POA: Diagnosis not present

## 2023-01-04 NOTE — Progress Notes (Signed)
Cardiology Office Note    Date:  01/06/2023   ID:  Jorge Jackson, DOB [birth year], MRN [id number]  PCP:  Lezlie Lye, Meda Coffee, MD  Cardiologist:  Nicki Guadalajara, MD ( sleep); Dr. Allyson Sabal  66-month follow-up sleep evaluation   History of Present Illness:  Jorge Jackson is a 67 y.o. male who I saw in 2018 for an initial sleep  evaluation following initiation of CPAP therapy.  I had not seen him since his initial evaluation in February 2018 and last saw him for 6-1/2-year follow-up evaluation on October 31, 2022.  He presents for follow-up evaluation after receiving a new CPAP device.  Jorge Jackson is followed by Dr. Allyson Sabal for his cardiology care.  He underwent CABG revascularization surgery in February 2005 by Dr. Morton Peters and had a LIMA placed to his LAD, veins to the PDA, OM1, and OM2 vessels.  When he had recently seen Dr. Allyson Sabal.  He was complaining of lethargy and fatigue, and there was concern of symptoms associated with sleep apnea.  He was referred for a sleep study which was done on 12/11/2015.  This revealed moderate sleep apnea with an AHI of 19.1 per hour; however sleep apnea was severe during REM sleep with an AHI of 43.0.  There was significant oxygen desaturation to a nadir of 76% and  moderate snoring.  CPAP was implemented and he was titrated up to 16 cm water pressure.  At that pressure. AHI was 0 and minimum oxygen saturation was 94%.  His DME company is [facility] [employer] and is set up date was 02/12/2016.  He has a ResMed AirSense 10 AutoSet unit which has been at a set pressure of 16 cm.  He has a [employer] F 20 medium mask.  A download was obtained from 02/20/2016 through 03/20/2016.  He is meeting compliance standards with 90% of usage stays and 83% of days with usage greater than 4 hours.  However, his average use is only 6 hours and 5 minutes.  AHI is 8.8, with an apnea index of 7.0, and a hypopnea index of 1.8.  Central index was 0.7.  Except for 2 days, there was no  significant leak.  Typically he goes to bed at 9 PM but wakes up at 3:50 AM since he works for [employer] and is at work at 5 AM.  Since initiating CPAP, he feels better.  He is rested when he awakens.  The past he had significant snoring on his back.  An Epworth Sleepiness Scale score was calculated in the office and this endorsed at 12, consistent with mild residual daytime sleepiness as shown below.  Epworth Sleepiness Scale: Situation   Chance of Dozing/Sleeping (0 = never , 1 = slight chance , 2 = moderate chance , 3 = high chance )   sitting and reading 2   watching TV 2   sitting inactive in a public place 2   being a passenger in a motor vehicle for an hour or more 1   lying down in the afternoon 3   sitting and talking to someone 0   sitting quietly after lunch (no alcohol) 2   while stopped for a few minutes in traffic as the driver 0   Total Score  12   Since my 2018 sleep evaluation, Jorge Jackson has continued to be followed by Dr. Allyson Sabal for cardiology care.  He has peripheral vascular disease in October 2020 angiography revealed high-grade calcified left common  iliac artery stenosis, bilateral calcified mid SFA stenosis with occluded left popliteal artery.  He underwent diamondback orbital rotational atherectomy of the left common iliac arter in October 2020 and in November 2020 underwent staged intervention of his left SFA and popliteal artery.  He last saw him in February 2024 and was remaining stable.   He is on DAPT with aspirin/clopidogrel.  He continues to be on metoprolol succinate 75 mg and olmesartan 20 mg for hypertension.  He is on rosuvastatin 40 mg for hyperlipidemia.  He has a prescription for tadalafil as needed for erectile dysfunction.  He is also followed in pain clinic in [facility] due to back discomfort difficulties.  From a sleep perspective, he has continued to use CPAP.  I obtained a download from September 1 through October 31, 2022 which shows  excellent compliance with 100% use and average sleep with CPAP at 6 hours 20 minutes.  His pressure set at a range of 12 to 20 cm and AHI was 3.0.  95th percentile pressure 16.1 with maximum average pressure 17.5.  He is in need to establish with a new DME company.  In addition, his CPAP device is over 64-1/65 years old and he qualifies for new machine and  is in need for a new mask and his current mask shows moderate leak.    Since I saw him he had undergone PV evaluations ordered by Dr. Allyson Sabal with lower extremity arterial duplex, carotid artery duplex and abdominal aortic studies and will be following up with Dr. Allyson Sabal regarding these results.  He received a new ResMed AirSense 11 AutoSet unit with set up date on December 06, 2022.  His DME company is now [facility] in [place].  Apparently, shortly after getting his machine he was still using his old machine and started to use his new machine on December 25, 2022.  A download of his new machine shows 100% use with average use at 7 hours and 17 minutes.  His CPAP is set at a pressure of 13 to 20 cm of water and AHI is excellent at 1.9.  Compliance is 100%, however, we do not have 30 days of data with his current machine and this can be obtained in several weeks.  He presents for evaluation.     Past Medical History:  Diagnosis Date   Anxiety and depression    CAD (coronary artery disease) 03/2003   hx CABG X 5    Erectile dysfunction    H/O cardiovascular stress test 2012   EF 76%, normal perfusion   H/O echocardiogram 02/25/2009   EF >55%, suggestion of impaired LV relaxtion, tr MR   Hx of myocardial infarction 03/2003   cath-CABG   Hyperlipidemia LDL goal <70    Mild carotid artery disease (HCC) 08/12/2011   Lt carotid by ultrasound, 50-69%, rt 0-49%   PVD (peripheral vascular disease) (HCC)     Past Surgical History:  Procedure Laterality Date   ABDOMINAL AORTOGRAM W/LOWER EXTREMITY Bilateral 11/05/2018   Procedure:  ABDOMINAL AORTOGRAM W/LOWER EXTREMITY;  Surgeon: Runell Gess, MD;  Location: [facility] INVASIVE CV LAB;  Service: Cardiovascular;  Laterality: Bilateral;   CARDIAC CATHETERIZATION     COLONOSCOPY  04/07/2010   Mild sigmoid diverticulosos. Small internal hemorrhoids. Otherwise grossly normal colonsocopy   CORONARY ARTERY BYPASS GRAFT  03/05/2003   LIMA-LAD; VG-PDA; VG-OM1; sequential VG-OM 2 and OM 3   LOWER EXTREMITY ANGIOGRAPHY Left 12/10/2018   Procedure: LOWER EXTREMITY ANGIOGRAPHY;  Surgeon: Nanetta Batty  J, MD;  Location: [facility] INVASIVE CV LAB;  Service: Cardiovascular;  Laterality: Left;   PERIPHERAL VASCULAR ATHERECTOMY  11/05/2018   Procedure: PERIPHERAL VASCULAR ATHERECTOMY;  Surgeon: Runell Gess, MD;  [facility] INVASIVE CV LAB;  Service: Cardiovascular;;   PERIPHERAL VASCULAR ATHERECTOMY Left 12/10/2018   Procedure: PERIPHERAL VASCULAR ATHERECTOMY;  Surgeon: Runell Gess, MD;  Location: [facility] INVASIVE CV LAB;  Service: Cardiovascular;  Laterality: Left;  Left SFA   PERIPHERAL VASCULAR INTERVENTION  11/05/2018   Procedure: PERIPHERAL VASCULAR INTERVENTION;  Surgeon: Runell Gess, MD;  Location: [facility] INVASIVE CV LAB;  Service: Cardiovascular;;   PERIPHERAL VASCULAR INTERVENTION Left 12/10/2018   Procedure: PERIPHERAL VASCULAR INTERVENTION;  Surgeon: Runell Gess, MD;  Location: [facility] INVASIVE CV LAB;  Service: Cardiovascular;  Laterality: Left;  left SFA    Current Medications: Outpatient Medications Prior to Visit  Medication Sig Dispense Refill   aspirin EC 81 MG tablet Take 81 mg by mouth daily.     CIALIS 20 MG tablet Take 20 mg by mouth daily as needed for erectile dysfunction.      clopidogrel (PLAVIX) 75 MG tablet Take 75 mg by mouth daily with breakfast.     co-enzyme Q-10 30 MG capsule Take by mouth.     metoprolol succinate (TOPROL-XL) 50 MG 24 hr tablet Take 75 mg by mouth daily. Take with or immediately following a meal.     nitroGLYCERIN (NITROSTAT) 0.4 MG  SL tablet Place 1 tablet (0.4 mg total) under the tongue every 5 (five) minutes as needed for chest pain. 25 tablet 3   olmesartan (BENICAR) 20 MG tablet Take 20 mg by mouth daily.     Omega-3 Fatty Acids (OMEGA 3 PO) Take 950 mg by mouth 2 (two) times daily.     rosuvastatin (CRESTOR) 40 MG tablet Take 40 mg by mouth every evening.      Saw Palmetto, Serenoa repens, (SAW PALMETTO PO) Take 1 capsule by mouth daily.      tamsulosin (FLOMAX) 0.4 MG CAPS capsule Take 0.8 mg by mouth every evening.      traMADol (ULTRAM) 50 MG tablet Take 50 mg by mouth 5 (five) times daily as needed (pain.).     Ubiquinone (ULTRA COQ10 PO) Take 1 capsule by mouth daily.     sertraline (ZOLOFT) 50 MG tablet Take 50 mg by mouth daily.     Facility-Administered Medications Prior to Visit  Medication Dose Route Frequency Provider Last Rate Last Admin   0.9 %  sodium chloride infusion  500 mL Intravenous Once Lynann Bologna, MD         Allergies:   Patient has no known allergies.   Social History   Socioeconomic History   Marital status: Married    Spouse name: Not on file   Number of children: Not on file   Years of education: Not on file   Highest education level: Not on file  Occupational History   Not on file  Tobacco Use   Smoking status: Former    Current packs/day: 0.00    Types: Cigarettes    Quit date: 02/01/2003    Years since quitting: 19.9   Smokeless tobacco: Former    Types: Chew  Substance and Sexual Activity   Alcohol use: Yes    Alcohol/week: 1.0 standard drink of alcohol    Types: 1 Cans of beer per week   Drug use: Not Currently   Sexual activity: Not on file  Other Topics Concern  Not on file  Social History Narrative   Not on file   Social Determinants of Health   Financial Resource Strain: Medium Risk (10/25/2022)   Received [facility]   Overall Financial Resource Strain (CARDIA)    Difficulty of Paying Living Expenses: Somewhat hard  Food Insecurity: No Food  Insecurity (10/25/2022)   Received from [facility]   Hunger Vital Sign    Worried About Running Out of Food in the Last Year: Never true    Ran Out of Food in the Last Year: Never true  Transportation Needs: No Transportation Needs (10/25/2022)   Received from [facility] - Transportation    Lack of Transportation (Medical): No    Lack of Transportation (Non-Medical): No  Physical Activity: Not on file  Stress: Not on file  Social Connections: Unknown (10/10/2022)   Received from [facility]   Social Network    Social Network: Not on file     Family History:  The patient's family history includes Heart disease in his mother; Stroke (age of onset: 54) in his mother; Sudden death in his father.   ROS General: Negative; No fevers, chills, or night sweats;  HEENT: Negative; No changes in vision or hearing, sinus congestion, difficulty swallowing Pulmonary: Negative; No cough, wheezing, shortness of breath, hemoptysis Cardiovascular: Status post CABG, status post no atherectomy lower extremity for PVD; no recent chest pain or increasing shortness of breath GI: Negative; No nausea, vomiting, diarrhea, or abdominal pain GU: Negative; No dysuria, hematuria, or difficulty voiding Musculoskeletal: Back discomfort Hematologic/Oncology: Negative; no easy bruising, bleeding Endocrine: Negative; no heat/cold intolerance; no diabetes Neuro: Negative; no changes in balance, headaches Skin: Negative; No rashes or skin lesions Psychiatric: Negative; No behavioral problems, depression Sleep: OSA on CPAP therapy with initial set up date February 12, 2016 with previous [facility] as his DME company.    No residual daytime sleepiness, no bruxism, restless legs, hypnogognic hallucinations, no cataplexy Other comprehensive 14 point system review is negative.   PHYSICAL EXAM:   VS:  BP 108/78   Pulse 68   Ht 5\' 7"  (1.702 m)   Wt 195 lb 3.2 oz (88.5 kg)   SpO2 95%   BMI 30.57  kg/m     Repeat blood pressure by me 114/76.  Wt Readings from Last 3 Encounters:  01/04/23 195 lb 3.2 oz (88.5 kg)  11/16/22 190 lb 6.4 oz (86.4 kg)  10/31/22 193 lb 9.6 oz (87.8 kg)    General: Alert, oriented, no distress.  Skin: normal turgor, no rashes, warm and dry HEENT: Normocephalic, atraumatic. Pupils equal round and reactive to light; sclera anicteric; extraocular muscles intact;  Nose without nasal septal hypertrophy Mouth/Parynx benign; Mallinpatti scale 3 Neck: No JVD, no carotid bruits; normal carotid upstroke Lungs: clear to ausculatation and percussion; no wheezing or rales Chest wall: without tenderness to palpitation Heart: PMI not displaced, RRR, s1 s2 normal, 1/6 systolic murmur, no diastolic murmur, no rubs, gallops, thrills, or heaves Abdomen: soft, nontender; no hepatosplenomehaly, BS+; abdominal aorta nontender and not dilated by palpation. Back: no CVA tenderness Pulses 2+ Musculoskeletal: full range of motion, normal strength, no joint deformities Extremities: no clubbing cyanosis or edema, Homan's sign negative  Neurologic: grossly nonfocal; Cranial nerves grossly wnl Psychologic: Normal mood and affect    Studies/Labs Reviewed:   EKG Interpretation Date/Time:  Wednesday January 04 2023 10:47:09 EST Ventricular Rate:  68 PR Interval:  208 QRS Duration:  82 QT Interval:  370  QTC Calculation: 393 R Axis:   -44  Text Interpretation: Normal sinus rhythm Left axis deviation When compared with ECG of 31-Oct-2022 10:50, Premature ventricular complexes are no longer Present Nonspecific T wave abnormality, improved in Lateral leads Confirmed by Nicki Guadalajara ([id number]) on 01/04/2023 12:08:37 PM    I personally reviewed his last ECG from 11/06/2015 which showed normal sinus rhythm at 67 bpm.  There was no ectopy.  There were normal intervals.  Recent Labs:    Latest Ref Rng & Units 12/11/2018    3:41 AM 12/06/2018    2:21 PM 11/06/2018    3:27 AM  BMP   Glucose 70 - 99 mg/dL 469  77  95   BUN 8 - 23 mg/dL 6  11  9    Creatinine 0.61 - 1.24 mg/dL 6.29  5.28  4.13   BUN/Creat Ratio 10 - 24  13    Sodium 135 - 145 mmol/L 138  139  138   Potassium 3.5 - 5.1 mmol/L 4.1  4.2  4.0   Chloride 98 - 111 mmol/L 104  101  105   CO2 22 - 32 mmol/L 29  23  24    Calcium 8.9 - 10.3 mg/dL 8.8  9.4  8.8          No data to display             Latest Ref Rng & Units 12/11/2018    3:41 AM 12/06/2018    2:21 PM 11/06/2018    3:27 AM  CBC  WBC 4.0 - 10.5 K/uL 8.6  7.0  8.3   Hemoglobin 13.0 - 17.0 g/dL 24.4  01.0  27.2   Hematocrit 39.0 - 52.0 % 37.5  39.5  41.1   Platelets 150 - 400 K/uL 170  219  192    Lab Results  Component Value Date   MCV 91.5 12/11/2018   MCV 90 12/06/2018   MCV 91.5 11/06/2018   No results found for: "TSH" No results found for: "HGBA1C"   BNP No results found for: "BNP"  ProBNP No results found for: "PROBNP"   Lipid Panel  No results found for: "CHOL", "TRIG", "HDL", "CHOLHDL", "VLDL", "LDLCALC", "LDLDIRECT"   RADIOLOGY: No results found.   Additional studies/ records that were reviewed today include:  Office notes from Dr. Allyson Sabal.  Sleep study and sleep downloads.  Reviewed recent lower extremity arterial Doppler, carotid Doppler and abdominal aortic study.   ASSESSMENT:    1. OSA (obstructive sleep apnea)   2. Coronary artery disease involving native coronary artery of native heart without angina pectoris   3. Hx of CABG x 5   4. Primary hypertension   5. Hyperlipidemia LDL goal <70   6. Bilateral carotid artery stenosis   7. PAD (peripheral artery disease) [facility])     PLAN:  Jorge Jackson is a 67 year old gentleman who has a history of coronary as well as peripheral vascular disease.  He underwent prior prior CABG revascularization surgery and staged complex diamondback orbital atherectomy of his left common iliac artery and left SFA and popliteal artery.  He also has mild left internal  carotid artery stenosis.  Due to concerns for obstructive sleep apnea, he was found to have moderate overall sleep apnea with an AHI of 19.1/h however sleep apnea was severe during REM sleep with an AHI of 43.0 on his sleep evaluation in November 2017.  He had significant oxygen desaturation to a nadir of 76% and moderate snoring.  He was set up with a ResMed air sense 10 AutoSet unit with [facility] as his DME company in January 2018.  He has been on CPAP therapy ever since.  When I saw him for my initial sleep evaluation on March 23, 2016, he was sleeping approximately 6 hours per night.  His initial pressure was set at a pressure of 16 cm.  His AHI remained elevated at 8.8 and he had residual daytime sleepiness.  He was waking up at 3:30 in the morning to be at work at 5 AM.  During that evaluation I changed his CPAP mode to an auto mode with a range of 12 to 20 cm of water pressure.   After not having seen him in over 6 and half years, I evaluated him on October 31, 2022.  He qualified for new machine.  During that evaluation I had a thorough discussion with him concerning sleep apnea and its potential adverse effects on normal sleep architecture and potential adverse cardiovascular consequences which are well outlined in my last note.  He recently received a new ResMed AirSense 11 AutoSet unit with set up date on December 06, 2022.  He was placed on my schedule today for 30-day follow-up.  However, he never initiated using his new machine and was still using his old machine until he felt he had time to set it up which was on December 25, 2022.  Since initiating therapy with his new device, compliance is excellent at 100% with 8 out of 8 days from November 24 through December 1.  His current unit is set at a pressure range of 13 to 20 cm of water and his 95th percentile pressure is 16.2 with maximum average pressure 17.4.  AHI is excellent at 1.9.  Typically he goes to bed between 6 and 8 PM but has  to wake up by 3:25 AM to go to work.  He starts work at 6 AM and does not get home until after 4 PM.  We will obtain a download in another 3 weeks for verification of 30-day compliance.  However I commended him on his 100% use since starting his new device.  His DME is [facility] in [place].  He will be following up with Dr. Allyson Sabal regarding his recent duplex evaluations of lower extremity, carotid artery, and abdominal aorta.  His blood pressure today is stable on metoprolol succinate 75 mg, olmesartan 20 mg.  He is on DAPT with aspirin/Plavix.  He is on rosuvastatin 40 mg and over-the-counter omega-3 fatty acid for hyperlipidemia.  I will be available as needed for follow-up sleep assessment until my retirement and subsequently he will then need to be transitioned to another sleep provider.   Medication Adjustments/Labs and Tests Ordered: Current medicines are reviewed at length with the patient today.  Concerns regarding medicines are outlined above.  Medication changes, Labs and Tests ordered today are listed in the Patient Instructions below. Patient Instructions  Medication Instructions:  *If you need a refill on your cardiac medications before your next appointment, please call your pharmacy*   Lab Work: If you have labs (blood work) drawn today and your tests are completely normal, you will receive your results only by: MyChart Message (if you have MyChart) OR A paper copy in the mail If you have any lab test that is abnormal or we need to change your treatment, we will call you to review the results.  Follow-Up: At [facility], you and your  health needs are our priority.  As part of our continuing mission to provide you with exceptional heart care, we have created designated Provider Care Teams.  These Care Teams include your primary Cardiologist (physician) and Advanced Practice Providers (APPs -  Physician Assistants and Nurse Practitioners) who all work together to  provide you with the care you need, when you need it.  We recommend signing up for the patient portal called "MyChart".  Sign up information is provided on this After Visit Summary.  MyChart is used to connect with patients for Virtual Visits (Telemedicine).  Patients are able to view lab/test results, encounter notes, upcoming appointments, etc.  Non-urgent messages can be sent to your provider as well.   To learn more about what you can do with MyChart, go to [email or web address].    Your next appointment:    As needed for sleep concerns  Provider:   Nicki Guadalajara, MD     If you have any questions or concerns regarding your c-pap, bi-pap or sleep accessories, please contact Brandie Rorie at [phone].      Signed, Nicki Guadalajara, MD  01/06/2023 10:19 AM    [facility] [address], [address], [place], [place]  [address] Phone: [phone]

## 2023-01-04 NOTE — Patient Instructions (Addendum)
Medication Instructions:  *If you need a refill on your cardiac medications before your next appointment, please call your pharmacy*   Lab Work: If you have labs (blood work) drawn today and your tests are completely normal, you will receive your results only by: MyChart Message (if you have MyChart) OR A paper copy in the mail If you have any lab test that is abnormal or we need to change your treatment, we will call you to review the results.  Follow-Up: At [facility], you and your health needs are our priority.  As part of our continuing mission to provide you with exceptional heart care, we have created designated Provider Care Teams.  These Care Teams include your primary Cardiologist (physician) and Advanced Practice Providers (APPs -  Physician Assistants and Nurse Practitioners) who all work together to provide you with the care you need, when you need it.  We recommend signing up for the patient portal called "MyChart".  Sign up information is provided on this After Visit Summary.  MyChart is used to connect with patients for Virtual Visits (Telemedicine).  Patients are able to view lab/test results, encounter notes, upcoming appointments, etc.  Non-urgent messages can be sent to your provider as well.   To learn more about what you can do with MyChart, go to [email or web address].    Your next appointment:    As needed for sleep concerns  Provider:   Nicki Guadalajara, MD     If you have any questions or concerns regarding your c-pap, bi-pap or sleep accessories, please contact Brandie Rorie at [phone].

## 2023-01-06 ENCOUNTER — Encounter: Payer: Self-pay | Admitting: Cardiovascular Disease

## 2023-01-12 DIAGNOSIS — Z79899 Other long term (current) drug therapy: Secondary | ICD-10-CM | POA: Diagnosis not present

## 2023-05-10 ENCOUNTER — Ambulatory Visit (HOSPITAL_COMMUNITY)
Admission: RE | Admit: 2023-05-10 | Discharge: 2023-05-10 | Disposition: A | Discharge status: Home / Self Care | Payer: BC Managed Care – PPO | Source: Ambulatory Visit | Attending: Cardiovascular Disease | Admitting: Cardiovascular Disease

## 2023-05-10 DIAGNOSIS — I739 Peripheral vascular disease, unspecified: Secondary | ICD-10-CM | POA: Diagnosis not present

## 2023-05-11 LAB — VAS US ABI WITH/WO TBI
Left ABI: 0.72
Right ABI: 1.23

## 2023-05-16 ENCOUNTER — Telehealth: Payer: Self-pay | Admitting: Cardiovascular Disease

## 2023-05-16 NOTE — Telephone Encounter (Signed)
 Patient is returning call to review low extremity testing.

## 2023-05-16 NOTE — Telephone Encounter (Signed)
 Pt returning call

## 2023-05-16 NOTE — Telephone Encounter (Signed)
 Avanell Leigh, MD 05/10/2023 12:58 PM EDT     Progression of disease on the left.  If he has claudication needs return office as discussed, otherwise back in a year from his prior visit.     Attempted to call patient, no answer left message requesting a call back.

## 2023-05-16 NOTE — Telephone Encounter (Signed)
 Patient identification verified by 2 forms. Jorge Lucky, RN    Called and spoke to patient  Relayed provider result message below  Patient states:    -continue to have muscle pain   -pain occurs with activity in lower extremity  Patient scheduled for OV 4/21 at 8:30am with Dr. Katheryne Pane  Patient agrees, no questions at this time

## 2023-05-22 ENCOUNTER — Encounter: Payer: Self-pay | Admitting: Cardiovascular Disease

## 2023-05-22 ENCOUNTER — Ambulatory Visit: Attending: Cardiovascular Disease | Admitting: Cardiovascular Disease

## 2023-05-22 VITALS — BP 116/76 | HR 75 | Ht 67.0 in | Wt 199.0 lb

## 2023-05-22 DIAGNOSIS — G4733 Obstructive sleep apnea (adult) (pediatric): Secondary | ICD-10-CM | POA: Insufficient documentation

## 2023-05-22 DIAGNOSIS — I2510 Atherosclerotic heart disease of native coronary artery without angina pectoris: Secondary | ICD-10-CM | POA: Diagnosis not present

## 2023-05-22 DIAGNOSIS — E785 Hyperlipidemia, unspecified: Secondary | ICD-10-CM

## 2023-05-22 DIAGNOSIS — I779 Disorder of arteries and arterioles, unspecified: Secondary | ICD-10-CM | POA: Diagnosis not present

## 2023-05-22 DIAGNOSIS — I739 Peripheral vascular disease, unspecified: Secondary | ICD-10-CM

## 2023-05-22 MED ORDER — CILOSTAZOL 50 MG PO TABS: 50.0000 mg | ORAL_TABLET | Freq: Two times a day (BID) | ORAL | 1 refills | Status: DC

## 2023-05-22 NOTE — Assessment & Plan Note (Signed)
 History of hyperlipidemia on high-dose statin therapy with lipid profile performed 03/16/2023 revealing total cholesterol 116, LDL 53 and HDL 41.

## 2023-05-22 NOTE — Progress Notes (Signed)
 05/22/2023 Jorge Jackson   1955-04-09  [id number]  Primary Physician Elyce Hams, Marguerita Shih, MD Primary Cardiologist: Avanell Leigh MD FACP, [place], [place], [place]  HPI:  Jorge Jackson is a 68 y.o. mildly overweight married Caucasian male father of 1, grandfather to 1 grandson who I last saw in the office 11/16/2022.Aaron Aas  He is accompanied by his grandson Thurston Flow today he was born in 2005.  His mother, Daymion Nazaire, was also a patient of mine who passed away many years ago.  He lost his job at the end of 2011. 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 He had coronary artery bypass grafting in February of 2005 by Dr. Jerlyn Moons Trigt with a LIMA to his LAD, a vein to a PDA, OM1 and OM2 sequentially. He is totally asymptomatic. He has mild left internal carotid artery stenosis which we follow by duplex ultrasound. Dr. Marinell Siad follows his lipid profile. His last Myoview performed January 20, 2011 was nonischemic He does complain of lethargy and fatigue and relates symptoms compatible with obstructive sleep apnea. He does wear his CPAP.     Since I saw him 2 years ago he is done well from a cardiac point of view.  He denies chest pain or shortness of breath.  He does relate new onset left calf claudication over the last 8 to 12 months occurring at less than 100 yards.   I performed lower extremity arterial Doppler studies on him 10/11/2018 revealing a right ABI of 1.13 and a left of 0.7.  He did have a high-frequency signal in his mid left SFA and what appeared to be an occluded left tibioperoneal trunk.   I performed peripheral angiography on him 11/05/2018 revealing high-grade calcified left common iliac artery stenosis, bilateral calcified mid at SFA stenoses with an occluded left popliteal artery.  One-vessel runoff on the left and 3 on the right.  I performed diamondback orbital  rotational atherectomy, PTA and covered stenting of the entire left common iliac artery with a 8 mm x 59 mm long VBX covered stent.  She had an excellent angiographic result.  His claudication is minimally improved.  His ABI really has not changed.  The velocities in his left iliac are normal.     He presented on 12/10/2018 for staged intervention of his left SFA and popliteal artery using diamondback orbital atherectomy, drug-coated balloon angioplasty and nitinol self-expanding stenting which was all successful.  He does have an occluded left popliteal artery with a large bridging collateral to an anterior tibial.  His follow-up Doppler studies performed 12/20/2018 revealed a left ABI 0.76 with a patent SFA.  His claudication had markedly improved.   Since I saw him in the office approximately 8 months ago he has noticed some increasing left calf claudication.  He denies chest pain or shortness of breath.  His lower extremity arterial Doppler studies performed 11/07/2022 revealed a decline in his left ABI from 0.82-0.72.  His left iliac stent is patent as is his proximal left SFA stent although he appears to have had progression of disease in his distal left SFA.  When I saw him 8 months ago he did not feel that his claudication was bad enough to pursue intervention.  His claudication has somewhat progressed.  He is anticipating retiring in the near future.  We discussed strategic  options and decided to pursue pharmacologic therapy with Pletal  for 3 months.  After that if he does not enjoy any clinical benefit we will consider intervention.  Current Meds  Medication Sig   aspirin  EC 81 MG tablet Take 81 mg by mouth daily.   CIALIS 20 MG tablet Take 20 mg by mouth daily as needed for erectile dysfunction.    cilostazol  (PLETAL ) 50 MG tablet Take 1 tablet (50 mg total) by mouth 2 (two) times daily.   clopidogrel  (PLAVIX ) 75 MG tablet Take 75 mg by mouth daily with breakfast.   co-enzyme Q-10 30 MG capsule  Take by mouth.   metoprolol  succinate (TOPROL -XL) 50 MG 24 hr tablet Take 75 mg by mouth daily. Take with or immediately following a meal.   nitroGLYCERIN  (NITROSTAT ) 0.4 MG SL tablet Place 1 tablet (0.4 mg total) under the tongue every 5 (five) minutes as needed for chest pain.   olmesartan (BENICAR) 20 MG tablet Take 20 mg by mouth daily.   Omega-3 Fatty Acids (OMEGA 3 PO) Take 950 mg by mouth 2 (two) times daily.   rosuvastatin  (CRESTOR ) 40 MG tablet Take 40 mg by mouth every evening.    Saw Palmetto, Serenoa repens, (SAW PALMETTO PO) Take 1 capsule by mouth daily.    tamsulosin  (FLOMAX ) 0.4 MG CAPS capsule Take 0.8 mg by mouth every evening.    traMADol  HCl 100 MG TABS Take 1 tablet by mouth every 6 (six) hours.   Current Facility-Administered Medications for the 05/22/23 encounter (Office Visit) with Avanell Leigh, MD  Medication   0.9 %  sodium chloride  infusion     No Known Allergies  Social History   Socioeconomic History   Marital status: Married    Spouse name: Not on file   Number of children: Not on file   Years of education: Not on file   Highest education level: Not on file  Occupational History   Not on file  Tobacco Use   Smoking status: Former    Current packs/day: 0.00    Types: Cigarettes    Quit date: 02/01/2003    Years since quitting: 20.3   Smokeless tobacco: Former    Types: Chew  Substance and Sexual Activity   Alcohol use: Yes    Alcohol/week: 1.0 standard drink of alcohol    Types: 1 Cans of beer per week   Drug use: Not Currently   Sexual activity: Not on file  Other Topics Concern   Not on file  Social History Narrative   Not on file   Social Drivers of Health   Financial Resource Strain: Medium Risk (10/25/2022)   [facility]   Overall Financial Resource Strain (CARDIA)    Difficulty of Paying Living Expenses: Somewhat hard  Food Insecurity: No Food Insecurity (10/25/2022)   [facility]   Hunger Vital  Sign    Worried About Running Out of Food in the Last Year: Never true    Ran Out of Food in the Last Year: Never true  Transportation Needs: No Transportation Needs (10/25/2022)   Received from [facility] - Transportation    Lack of Transportation (Medical): No    Lack of Transportation (Non-Medical): No  Physical Activity: Not on file  Stress: Not on file  Social Connections: Unknown (10/10/2022)   Received from [facility]   Social Network    Social Network: Not on file  Intimate Partner Violence: Unknown (10/10/2022)   Received from  [facility]   HITS    Physically Hurt: Not on file    Insult or Talk Down To: Not on file    Threaten Physical Harm: Not on file    Scream or Curse: Not on file     Review of Systems: General: negative for chills, fever, night sweats or weight changes.  Cardiovascular: negative for chest pain, dyspnea on exertion, edema, orthopnea, palpitations, paroxysmal nocturnal dyspnea or shortness of breath Dermatological: negative for rash Respiratory: negative for cough or wheezing Urologic: negative for hematuria Abdominal: negative for nausea, vomiting, diarrhea, bright red blood per rectum, melena, or hematemesis Neurologic: negative for visual changes, syncope, or dizziness All other systems reviewed and are otherwise negative except as noted above.    Blood pressure 116/76, pulse 75, height 5\' 7"  (1.702 m), weight 199 lb (90.3 kg), SpO2 96%.  General appearance: alert and no distress Neck: no adenopathy, no carotid bruit, no JVD, supple, symmetrical, trachea midline, and thyroid  not enlarged, symmetric, no tenderness/mass/nodules Lungs: clear to auscultation bilaterally Heart: regular rate and rhythm, S1, S2 normal, no murmur, click, rub or gallop Extremities: extremities normal, atraumatic, no cyanosis or edema Pulses: Diminished pedal pulses. Skin: Skin color, texture, turgor normal. No rashes or lesions Neurologic: Grossly  normal  EKG not performed today      ASSESSMENT AND PLAN:   CAD CABG X 5 '05. Low risk Myoview 12/12 History of CAD status post coronary bypass grafting February 2005 by Dr. Jeb Miner with a LIMA to his LAD, vein to the PDA, OM1 and OM2 sequentially.  Myoview performed 01/20/2011 was low risk and nonischemic.  He denies chest pain or shortness of breath.  Mild carotid artery disease (HCC) History of moderate bilateral ICA stenosis by duplex ultrasound performed 11/07/2022.  This will be repeated in 12 months  Hyperlipidemia LDL goal <70 History of hyperlipidemia on high-dose statin therapy with lipid profile performed 03/16/2023 revealing total cholesterol 116, LDL 53 and HDL 41.  Claudication in peripheral vascular disease (HCC) History of PAD status post orbital atherectomy and PTA using a VBX covered stent by myself initially 11/05/2018.  He did have high-grade disease in his proximal left SFA and an occluded popliteal with bridging collaterals.  He had one-vessel runoff on the left and 3 on the right.  His Dopplers really did not change nor did his claudication after that.  I came back and performed staged left SFA intervention 12/10/2018 with orbital atherectomy, DCB and stenting.  This resulted in marked improvement in his claudication.  I also performed PTA of his distal left SFA.  His popliteal was occluded and he had a large bridging collateral.  His claudication significantly improved after that.  I saw him back in October 2020 for with moderate progression of his claudication and Dopplers that suggested progression of disease in his distal left SFA.  He comes in today again with progressive claudication.  We discussed options and decided to pursue medical therapy with Pletal  twice daily for 3 months after which if he has had no significant improvement we will consider endovascular option.  Obstructive sleep apnea History of obstructive sleep apnea on CPAP followed by Dr.  Loetta Ringer.     Avanell Leigh MD FACP,FACC,FAHA, [facility] 05/22/2023 8:31 AM

## 2023-05-22 NOTE — Assessment & Plan Note (Signed)
 History of CAD status post coronary bypass grafting February 2005 by Dr. Jeb Miner with a LIMA to his LAD, vein to the PDA, OM1 and OM2 sequentially.  Myoview performed 01/20/2011 was low risk and nonischemic.  He denies chest pain or shortness of breath.

## 2023-05-22 NOTE — Patient Instructions (Signed)
 Medication Instructions:  Your physician has recommended you make the following change in your medication:   -Start cilostazol  (Pletal ) 50mg  twice daily.  *If you need a refill on your cardiac medications before your next appointment, please call your pharmacy*   Follow-Up: At [facility], you and your health needs are our priority.  As part of our continuing mission to provide you with exceptional heart care, our providers are all part of one team.  This team includes your primary Cardiologist (physician) and Advanced Practice Providers or APPs (Physician Assistants and Nurse Practitioners) who all work together to provide you with the care you need, when you need it.  Your next appointment:   3 month(s)  Provider:   Lauro Portal, MD     We recommend signing up for the patient portal called "MyChart".  Sign up information is provided on this After Visit Summary.  MyChart is used to connect with patients for Virtual Visits (Telemedicine).  Patients are able to view lab/test results, encounter notes, upcoming appointments, etc.  Non-urgent messages can be sent to your provider as well.   To learn more about what you can do with MyChart, go to [email or web address].   Other Instructions       1st Floor: - Lobby - Registration  - Pharmacy  - Lab - Cafe  2nd Floor: - PV Lab - Diagnostic Testing (echo, CT, nuclear med)  3rd Floor: - Vacant  4th Floor: - TCTS (cardiothoracic surgery) - AFib Clinic - Structural Heart Clinic - Vascular Surgery  - Vascular Ultrasound  5th Floor: - HeartCare Cardiology (general and EP) - Clinical Pharmacy for coumadin, hypertension, lipid, weight-loss medications, and med management appointments    Valet parking services will be available as well.

## 2023-05-22 NOTE — Assessment & Plan Note (Signed)
 History of PAD status post orbital atherectomy and PTA using a VBX covered stent by myself initially 11/05/2018.  He did have high-grade disease in his proximal left SFA and an occluded popliteal with bridging collaterals.  He had one-vessel runoff on the left and 3 on the right.  His Dopplers really did not change nor did his claudication after that.  I came back and performed staged left SFA intervention 12/10/2018 with orbital atherectomy, DCB and stenting.  This resulted in marked improvement in his claudication.  I also performed PTA of his distal left SFA.  His popliteal was occluded and he had a large bridging collateral.  His claudication significantly improved after that.  I saw him back in October 2020 for with moderate progression of his claudication and Dopplers that suggested progression of disease in his distal left SFA.  He comes in today again with progressive claudication.  We discussed options and decided to pursue medical therapy with Pletal  twice daily for 3 months after which if he has had no significant improvement we will consider endovascular option.

## 2023-05-22 NOTE — Assessment & Plan Note (Signed)
History of obstructive sleep apnea on CPAP followed by Dr. Kelly 

## 2023-05-22 NOTE — Assessment & Plan Note (Signed)
 History of moderate bilateral ICA stenosis by duplex ultrasound performed 11/07/2022.  This will be repeated in 12 months

## 2023-08-28 ENCOUNTER — Encounter: Payer: Self-pay | Admitting: Cardiovascular Disease

## 2023-08-28 ENCOUNTER — Ambulatory Visit: Attending: Cardiovascular Disease | Admitting: Cardiovascular Disease

## 2023-08-28 VITALS — BP 124/76 | HR 72 | Ht 67.0 in | Wt 194.8 lb

## 2023-08-28 DIAGNOSIS — I739 Peripheral vascular disease, unspecified: Secondary | ICD-10-CM

## 2023-08-28 DIAGNOSIS — I2510 Atherosclerotic heart disease of native coronary artery without angina pectoris: Secondary | ICD-10-CM

## 2023-08-28 DIAGNOSIS — I10 Essential (primary) hypertension: Secondary | ICD-10-CM

## 2023-08-28 DIAGNOSIS — I779 Disorder of arteries and arterioles, unspecified: Secondary | ICD-10-CM

## 2023-08-28 DIAGNOSIS — E785 Hyperlipidemia, unspecified: Secondary | ICD-10-CM

## 2023-08-28 MED ORDER — CILOSTAZOL 100 MG PO TABS: 100.0000 mg | ORAL_TABLET | Freq: Two times a day (BID) | ORAL | 1 refills | Status: DC

## 2023-08-28 NOTE — Progress Notes (Signed)
 Mr. Stamper returns today for follow-up of PAD.  I last saw him in the office 05/22/23.  He was complaining of lifestyle-limiting claudication.  His left ABI had decreased from 0.82 down to 0.64 with a high-frequency signal in his mid left SFA.  I began him on Pletal  50 mg p.o. twice daily which significantly improved his claudication.  He wishes to uptitrate this to 100 mg p.o. twice daily.  At this point, he does not wish to pursue an endovascular therapy given his improvement on pharmacologic therapy.  I will see him back in 3 months.  Dorn DOROTHA Lesches, M.D., FACP, [facility], FAHA, [facility]  [address] [place], [place]  [address]  [phone] 08/28/2023 3:21 PM

## 2023-08-28 NOTE — Patient Instructions (Signed)
 Medication Instructions:  Your physician has recommended you make the following change in your medication:   -Increase cilostazol  (pletal ) 100mg  twice daily.  *If you need a refill on your cardiac medications before your next appointment, please call your pharmacy*    Follow-Up: At [facility], you and your health needs are our priority.  As part of our continuing mission to provide you with exceptional heart care, our providers are all part of one team.  This team includes your primary Cardiologist (physician) and Advanced Practice Providers or APPs (Physician Assistants and Nurse Practitioners) who all work together to provide you with the care you need, when you need it.  Your next appointment:   3 month(s)  Provider:   Dorn Lesches, MD    We recommend signing up for the patient portal called MyChart.  Sign up information is provided on this After Visit Summary.  MyChart is used to connect with patients for Virtual Visits (Telemedicine).  Patients are able to view lab/test results, encounter notes, upcoming appointments, etc.  Non-urgent messages can be sent to your provider as well.   To learn more about what you can do with MyChart, go to [email or web address].

## 2023-11-14 ENCOUNTER — Ambulatory Visit: Payer: Self-pay | Admitting: Cardiovascular Disease

## 2023-11-14 ENCOUNTER — Ambulatory Visit (HOSPITAL_COMMUNITY)
Admission: RE | Admit: 2023-11-14 | Discharge: 2023-11-14 | Disposition: A | Discharge status: Home / Self Care | Payer: BC Managed Care – PPO | Source: Ambulatory Visit | Attending: Cardiovascular Disease | Admitting: Cardiovascular Disease

## 2023-11-14 DIAGNOSIS — I6523 Occlusion and stenosis of bilateral carotid arteries: Secondary | ICD-10-CM | POA: Diagnosis present

## 2023-12-04 ENCOUNTER — Ambulatory Visit: Admitting: Cardiovascular Disease

## 2023-12-07 ENCOUNTER — Ambulatory Visit: Attending: Cardiovascular Disease | Admitting: Cardiovascular Disease

## 2023-12-07 ENCOUNTER — Encounter: Payer: Self-pay | Admitting: Cardiovascular Disease

## 2023-12-07 VITALS — BP 130/79 | HR 75 | Ht 67.0 in | Wt 197.8 lb

## 2023-12-07 DIAGNOSIS — I739 Peripheral vascular disease, unspecified: Secondary | ICD-10-CM

## 2023-12-07 NOTE — Patient Instructions (Signed)
 Medication Instructions:  Your physician recommends that you continue on your current medications as directed. Please refer to the Current Medication list given to you today.  *If you need a refill on your cardiac medications before your next appointment, please call your pharmacy*   Follow-Up: At [facility], you and your health needs are our priority.  As part of our continuing mission to provide you with exceptional heart care, our providers are all part of one team.  This team includes your primary Cardiologist (physician) and Advanced Practice Providers or APPs (Physician Assistants and Nurse Practitioners) who all work together to provide you with the care you need, when you need it.  Your next appointment:   6 month(s)  Provider:   Dorn Lesches, MD

## 2023-12-07 NOTE — Progress Notes (Signed)
 Jorge Jackson returns today for follow-up of his PAD.  I last saw him in the office 08/28/2023.  He has had left common iliac artery orbital atherectomy, PTA covered stenting by myself 11/05/2018.  He had staged left SFA and popliteal orbital atherectomy, PTA and stenting 12/10/2018.  He has had progressive claudication with Dopplers performed 11/14/2023 revealing a patent mid left SFA stent with what appears to be high-grade disease in his distal left SFA.  I did increase his Pletal  from 50 twice daily to 100 mg p.o. twice daily which resulted in marked improvement in his claudication.  He also has moderate bilateral internal carotid artery stenosis by duplex ultrasound 11/14/2023.  Based on his improvement we decided to revisit this in 6 months.  If he has had progressive claudication would recommend repeat angiography.  Dorn DOROTHA Lesches, M.D., FACP, [facility], FAHA, [facility]  [address] [place], [place]  [address]  [phone] 12/07/2023 4:24 PM

## 2024-01-02 ENCOUNTER — Other Ambulatory Visit: Payer: Self-pay | Admitting: Cardiovascular Disease

## 2024-02-25 ENCOUNTER — Other Ambulatory Visit: Payer: Self-pay | Admitting: Cardiovascular Disease
